# Patient Record
Sex: Male | Born: 1935 | Race: Black or African American | Hispanic: No | Marital: Married | Smoking: Former smoker
Health system: Southern US, Community
[De-identification: ages and names within clinical notes are randomized; demographics above are authoritative.]

## PROBLEM LIST (undated history)

## (undated) DIAGNOSIS — I10 Essential (primary) hypertension: Secondary | ICD-10-CM

## (undated) DIAGNOSIS — M1710 Unilateral primary osteoarthritis, unspecified knee: Secondary | ICD-10-CM

## (undated) DIAGNOSIS — G309 Alzheimer's disease, unspecified: Secondary | ICD-10-CM

## (undated) DIAGNOSIS — F0280 Dementia in other diseases classified elsewhere without behavioral disturbance: Secondary | ICD-10-CM

## (undated) DIAGNOSIS — Z9889 Other specified postprocedural states: Secondary | ICD-10-CM

## (undated) DIAGNOSIS — I2510 Atherosclerotic heart disease of native coronary artery without angina pectoris: Secondary | ICD-10-CM

## (undated) DIAGNOSIS — M109 Gout, unspecified: Secondary | ICD-10-CM

## (undated) DIAGNOSIS — C801 Malignant (primary) neoplasm, unspecified: Secondary | ICD-10-CM

## (undated) HISTORY — PX: KNEE SURGERY: SHX244

## (undated) HISTORY — PX: CORONARY ANGIOPLASTY WITH STENT PLACEMENT: SHX49

---

## 2001-07-02 ENCOUNTER — Ambulatory Visit (HOSPITAL_COMMUNITY): Admission: RE | Admit: 2001-07-02 | Discharge: 2001-07-02 | Payer: Self-pay | Admitting: Cardiology

## 2001-07-02 ENCOUNTER — Encounter: Payer: Self-pay | Admitting: Cardiology

## 2002-03-25 ENCOUNTER — Inpatient Hospital Stay (HOSPITAL_COMMUNITY): Admission: EM | Admit: 2002-03-25 | Discharge: 2002-03-28 | Payer: Self-pay | Admitting: Emergency Medicine

## 2002-03-25 ENCOUNTER — Encounter: Payer: Self-pay | Admitting: Emergency Medicine

## 2002-03-26 ENCOUNTER — Encounter: Payer: Self-pay | Admitting: Cardiology

## 2002-03-27 ENCOUNTER — Encounter: Payer: Self-pay | Admitting: Cardiology

## 2002-12-24 ENCOUNTER — Encounter: Payer: Self-pay | Admitting: Emergency Medicine

## 2002-12-24 ENCOUNTER — Inpatient Hospital Stay (HOSPITAL_COMMUNITY): Admission: EM | Admit: 2002-12-24 | Discharge: 2003-01-01 | Payer: Self-pay | Admitting: Emergency Medicine

## 2002-12-25 ENCOUNTER — Encounter: Payer: Self-pay | Admitting: Neurology

## 2002-12-25 ENCOUNTER — Encounter: Payer: Self-pay | Admitting: Cardiology

## 2002-12-29 ENCOUNTER — Encounter: Payer: Self-pay | Admitting: Neurology

## 2003-11-10 ENCOUNTER — Ambulatory Visit (HOSPITAL_COMMUNITY): Admission: RE | Admit: 2003-11-10 | Discharge: 2003-11-10 | Payer: Self-pay | Admitting: Cardiology

## 2005-10-25 ENCOUNTER — Inpatient Hospital Stay (HOSPITAL_BASED_OUTPATIENT_CLINIC_OR_DEPARTMENT_OTHER): Admission: RE | Admit: 2005-10-25 | Discharge: 2005-10-25 | Payer: Self-pay | Admitting: Cardiology

## 2005-11-06 ENCOUNTER — Inpatient Hospital Stay (HOSPITAL_COMMUNITY): Admission: RE | Admit: 2005-11-06 | Discharge: 2005-11-07 | Payer: Self-pay | Admitting: Cardiology

## 2006-05-11 ENCOUNTER — Emergency Department (HOSPITAL_COMMUNITY): Admission: EM | Admit: 2006-05-11 | Discharge: 2006-05-11 | Payer: Self-pay | Admitting: Emergency Medicine

## 2007-07-08 ENCOUNTER — Inpatient Hospital Stay (HOSPITAL_BASED_OUTPATIENT_CLINIC_OR_DEPARTMENT_OTHER): Admission: RE | Admit: 2007-07-08 | Discharge: 2007-07-08 | Payer: Self-pay | Admitting: Cardiology

## 2007-10-13 ENCOUNTER — Ambulatory Visit (HOSPITAL_COMMUNITY): Admission: RE | Admit: 2007-10-13 | Discharge: 2007-10-13 | Payer: Self-pay | Admitting: Urology

## 2007-11-16 ENCOUNTER — Emergency Department (HOSPITAL_COMMUNITY): Admission: EM | Admit: 2007-11-16 | Discharge: 2007-11-16 | Payer: Self-pay | Admitting: Family Medicine

## 2010-01-04 ENCOUNTER — Inpatient Hospital Stay (HOSPITAL_COMMUNITY): Admission: EM | Admit: 2010-01-04 | Discharge: 2010-01-06 | Payer: Self-pay | Admitting: Emergency Medicine

## 2010-07-27 LAB — BASIC METABOLIC PANEL
BUN: 7 mg/dL (ref 6–23)
Calcium: 7.8 mg/dL — ABNORMAL LOW (ref 8.4–10.5)
Calcium: 8.1 mg/dL — ABNORMAL LOW (ref 8.4–10.5)
Chloride: 101 mEq/L (ref 96–112)
Creatinine, Ser: 1.48 mg/dL (ref 0.4–1.5)
GFR calc Af Amer: 56 mL/min — ABNORMAL LOW (ref >60)
GFR calc non Af Amer: 57 mL/min — ABNORMAL LOW (ref >60)
Glucose, Bld: 111 mg/dL — ABNORMAL HIGH (ref 70–99)
Sodium: 137 mEq/L (ref 135–145)

## 2010-07-27 LAB — DIFFERENTIAL
Eosinophils Absolute: 0.2 10*3/uL (ref 0.0–0.7)
Eosinophils Relative: 2 % (ref 0–5)
Lymphs Abs: 1.4 10*3/uL (ref 0.7–4.0)
Monocytes Absolute: 1.3 10*3/uL — ABNORMAL HIGH (ref 0.1–1.0)
Monocytes Relative: 14 % — ABNORMAL HIGH (ref 3–12)
Neutrophils Relative %: 68 % (ref 43–77)

## 2010-07-27 LAB — POCT CARDIAC MARKERS
CKMB, poc: <1 ng/mL — ABNORMAL LOW (ref 1.0–8.0)
Myoglobin, poc: 145 ng/mL (ref 12–200)
Myoglobin, poc: 166 ng/mL (ref 12–200)

## 2010-07-27 LAB — COMPREHENSIVE METABOLIC PANEL
ALT: 12 U/L (ref 0–53)
AST: 25 U/L (ref 0–37)
Albumin: 3.3 g/dL — ABNORMAL LOW (ref 3.5–5.2)
Alkaline Phosphatase: 77 U/L (ref 39–117)
Calcium: 8.3 mg/dL — ABNORMAL LOW (ref 8.4–10.5)
GFR calc Af Amer: 44 mL/min — ABNORMAL LOW (ref >60)
Potassium: 3.5 mEq/L (ref 3.5–5.1)
Sodium: 137 mEq/L (ref 135–145)
Total Protein: 6.8 g/dL (ref 6.0–8.3)

## 2010-07-27 LAB — CBC
HCT: 34.4 % — ABNORMAL LOW (ref 39.0–52.0)
MCH: 26.7 pg (ref 26.0–34.0)
MCHC: 32 g/dL (ref 30.0–36.0)
MCHC: 33.1 g/dL (ref 30.0–36.0)
MCV: 81.6 fL (ref 78.0–100.0)
Platelets: 153 10*3/uL (ref 150–400)
Platelets: 160 10*3/uL (ref 150–400)
RDW: 13.4 % (ref 11.5–15.5)
RDW: 13.5 % (ref 11.5–15.5)
RDW: 13.5 % (ref 11.5–15.5)
WBC: 9.6 10*3/uL (ref 4.0–10.5)

## 2010-07-27 LAB — URIC ACID: Uric Acid, Serum: 7.9 mg/dL — ABNORMAL HIGH (ref 4.0–7.8)

## 2010-07-27 LAB — LIPID PANEL
HDL: 38 mg/dL — ABNORMAL LOW (ref >39)
Total CHOL/HDL Ratio: 4.1 RATIO
VLDL: 21 mg/dL (ref 0–40)

## 2010-07-27 LAB — APTT: aPTT: 108 seconds — ABNORMAL HIGH (ref 24–37)

## 2010-07-27 LAB — CARDIAC PANEL(CRET KIN+CKTOT+MB+TROPI)
Total CK: 147 U/L (ref 7–232)
Troponin I: 0.02 ng/mL (ref 0.00–0.06)

## 2010-07-27 LAB — PROTIME-INR: INR: 1.13 (ref 0.00–1.49)

## 2010-07-27 LAB — CK TOTAL AND CKMB (NOT AT ARMC)
CK, MB: 2.3 ng/mL (ref 0.3–4.0)
Total CK: 123 U/L (ref 7–232)
Total CK: 155 U/L (ref 7–232)

## 2010-09-26 NOTE — Cardiovascular Report (Signed)
NAME:  Patrick [name], Patrick [name]               ACCOUNT NO.:  [id number]   MEDICAL RECORD NO.:  [id number]          PATIENT TYPE:  OIB   LOCATION:  NA                           FACILITY:  [facility]   PHYSICIAN:  Mohan N. Sharyn Lull, M.D. DATE OF BIRTH:  July 04, 1930   DATE OF PROCEDURE:  07/08/2007  DATE OF DISCHARGE:                            CARDIAC CATHETERIZATION   PROCEDURE:  Left cardiac cath with selective left and right coronary  angiography, left ventriculography via right groin using Judkins  technique.   INDICATION FOR THE PROCEDURE:  Mr. Ozer is a 77 year old black male  with past medical history significant for coronary artery disease,  status post PCI to LAD and RCA in August 2004, hypertension, history of  CVA, hypercholesteremia, hyperhomocysteinemia, gouty arthritis.  Complains of retrosternal chest pressure, localized, associated with  diaphoresis, relieved with rest and sublingual nitro.  Denies any  nausea, vomiting.  Denies shortness of breath.  Denies palpitation,  lightheadedness or syncope.  Denies PND, orthopnea, leg swelling.  EKG  done in my office showed new minor T-wave inversions in the inferior  leads.   PAST MEDICAL HISTORY:  As above, plus history of CA of prostate.   PAST SURGICAL HISTORY:  He has had bilateral knee surgery.  He had PCI  to LAD and RCA as above.   ALLERGIES:  NO KNOWN DRUG ALLERGIES.   MEDICATIONS:  At home, he is on enteric-coated aspirin 81 mg p.o. daily,  Plavix 75 mg p.o. daily, Toprol XL 50 mg p.o. daily, Norvasc 5 mg p.o.  daily, Imdur 30 mg p.o. daily, Niaspan 500 mg p.o. daily, colchicine 0.6  mg p.o. daily, doxazosin 1 tablet daily, I believe it is 2 mg daily at  night, Nitrostat 0.4 mg sublingual, use as directed.   SOCIAL HISTORY:  He is married and has 10 children.  Smoked 1 pack per  week for 24 years, quit 30+ years ago.  No history of alcohol abuse.  He  is retired.  He worked as a Editor, commissioning.   FAMILY HISTORY:  Positive for  coronary artery disease.   PHYSICAL EXAMINATION:  GENERAL:  On examination, he is alert and  oriented x 3, in no acute distress.  VITAL SIGNS:  Blood pressure was 140/86, pulse was 70 and regular.  HEENT:  Conjunctivae was pink.  NECK:  Supple.  No JVD.  No bruit.  LUNGS:  Clear to auscultation without rhonchi or rales.  CARDIOVASCULAR:  S1 and S2 were normal.  There was a soft systolic  murmur.  ABDOMEN:  Abdomen was soft.  Bowel sounds were present.  Obese,  nontender.  EXTREMITIES:  There was no clubbing, cyanosis or edema.   IMPRESSION:  1. New onset angina, rule out progression of coronary artery disease.  2. Coronary artery disease, status post percutaneous coronary      intervention to left anterior descending and right coronary artery      in the past.  3. Hypertension.  4. Hypercholesteremia.  5. Hyperhomocysteinemia.  6. History of cerebrovascular accident, cancer of the prostate.  7. Remote history of tobacco abuse.  Discussed with the patient regarding left cath, its risks and benefits,  i.e. death, stroke, local vascular complications, etc., and he consented  for the procedure.   PROCEDURE:  After obtaining informed consent, the patient was brought to  the cath lab and was placed on fluoroscopy table.  Right groin was  prepped and draped in the usual fashion, and 2% Xylocaine was used for  local anesthesia in the right groin.  With the help of a thin-wall  needle, initially a 4-French sheath was placed, which was replaced with  a 5-French sheath without difficulty.  Next, a 4-French Judkins catheter  was advanced over the wire under fluoroscopic guidance up to the  ascending aorta.  Wire was pulled out.  Catheter was aspirated and  connected to the manifold.  Catheter was further advanced and attempted  to engage into left coronary ostium without success.  This catheter was  switched to a 5-French left Judkins catheter, which was advanced over  the wire under  fluoroscopic guidance up to the ascending aorta.  Wire  was pulled out.  The catheter was aspirated and connected to the  manifold.  The catheter was further advanced and engaged into left  coronary ostium without difficulty.  Multiple views of the left system  were taken.  Next, the catheter was disengaged and was pulled out over  the wire and was replaced with 4-French 3-D right diagnostic catheter,  which was advanced over the wire under fluoroscopic guidance up to the  ascending aorta.  Wire was pulled out.  The catheter was aspirated and  connected to the manifold.  Catheter was further advanced and engaged  into right coronary ostium.  Multiple views of the right system were  taken.  Next, the catheter was disengaged and was pulled out over the  wire and was replaced with a 4-French pigtail catheter, which was  advanced over the wire under fluoroscopic guidance up to the ascending  aorta.  Wire was pulled out,  the catheter was aspirated and connected  to the manifold.  Catheter was further advanced across aortic valve into  the LV.  LV pressures were recorded.  Next, LV gram was done in 30  degree RAO position.  Post angiographic pressures were recorded from LV  and then pullback pressures were recorded from the aorta.  There was no  gradient across the aortic valve.  Next, the pigtail catheter was pulled  out over the wire.  Sheaths were aspirated and flushed.   FINDINGS:  LV showed good LV systolic function, EF of 55% to 60%.  Left  main had 20% distal stenosis.  The LAD has 30% to 40% in-stent proximal  restenosis and 25% to 35% distal stenosis.  Diagonal 1 has complex  proximal bifurcation lesion, which is 70% to 75%, which is not suitable  for PCI.  Diagonal 2 is very small, ramus has 50% to 60% proximal  stenosis.  Left circumflex has 50% to 60% proximal stenosis right at  bifurcation with the ramus and 15% to 20% mid stenosis.  The OM1 and OM2  were very, very small.  OM3  had 20% to 30% proximal and mid stenosis.  OM4 was very, very small.  RCA had 40% to 50% proximal stenosis and 20%  to 30% mid stenosis and 70% to 75% distal stenosis at the bifurcation  with PDA.  Distal RCA is a small vessel.  The PDA has 20% to 30%  multiple sequential stenoses.   The patient  tolerated the procedure well.  There were no complications.  The plan is to maximize antianginal medications and treat medically.      Eduardo Osier. Sharyn Lull, M.D.  Electronically Signed     MNH/MEDQ  D:  07/08/2007  T:  07/08/2007  Job:  [id number]   cc:   Cath Lab

## 2010-09-29 NOTE — Discharge Summary (Signed)
NAME:  Patrick [name], Patrick [name]                         ACCOUNT NO.:  [id number]   MEDICAL RECORD NO.:  [id number]                   PATIENT TYPE:  INP   LOCATION:  5531                                 FACILITY:  [facility]   PHYSICIAN:  Osvaldo Shipper. Spruill, M.D.             DATE OF BIRTH:  Nov 04, 1930   DATE OF ADMISSION:  03/25/2002  DATE OF DISCHARGE:  03/28/2002                                 DISCHARGE SUMMARY   DISCHARGE DIAGNOSES:  1. Coronary artery disease.  2. Hypertension.  3. Obesity.   HISTORY OF PRESENT ILLNESS:  The patient is a 77 year old patient who  presented initially to the emergency department of the [facility] on March 25, 2002, at which time he complained of  substernal area chest pain.  The patient states that he took two of the  sublingual nitroglycerin with relief.  He was brought to the hospital by EMS  and was evaluated in the department.  During the course of his evaluation,  his chemistries were noted to be within normal limits.  His echocardiogram  revealed some nonspecific STT wave changes.  His chest x-ray revealed some  atelectasis at the right base but no other significant problem.  It was the  opinion after his work-up and evaluation that he should be admitted to rule  out myocardial injury.   HOSPITAL COURSE:  The patient was admitted to the medical service, was  placed on telemetry.  He was seen on pharmacy consultation for Lovenox  administration.  The patient underwent a Cardiolite study.  There was noted  a small subtle area of decreased activity in the apex extending into the  anterior wall at the apex.  It was felt to be insignificant by the radiology  staff.  The patient was noted to have an ejection fraction of 58% and as  noted above, no evidence of ischemia.  The patient then had an esophagogram  done which revealed no stricture, mass or filling defect and no diffuse  impairment in the esophageal motility.  Serial enzymes  were negative for  acute injury.  Electrocardiograms showed normal sinus rhythm with T-wave  abnormalities in the inferior and anterolateral area.   On March 28, 2002, it was the opinion that the patient had received  maximum benefit from hospitalization and could be discharged home with  continuation of work-up as an outpatient.   DISCHARGE MEDICATIONS:  1. Toprol XL 50 one daily.  2. Celebrex 200 mg daily.  3. Isosorbide daily.  4. Allopurinol 300 mg daily.  5. Cardura one at bedtime.  6. Protonix 40 mg daily.  7. Colchicine 0.6 mg one three times a day.   DIET:  The patient was placed on a 2 g sodium restricted low cholesterol  diet.    FOLLOW UP:  The patient is to be seen in the office in one week.  The  patient is to  notify physician immediately if there are any changes,  problems, or concerns.     Ivery Quale, P.A.                       Osvaldo Shipper. Spruill, M.D.    HB/MEDQ  D:  05/19/2002  T:  05/19/2002  Job:  [id number]

## 2010-09-29 NOTE — Discharge Summary (Signed)
NAME:  Patrick [name], Patrick [name]               ACCOUNT NO.:  [id number]   MEDICAL RECORD NO.:  [id number]          PATIENT TYPE:  INP   LOCATION:  6533                         FACILITY:  [facility]   PHYSICIAN:  Mohan N. Sharyn Lull, M.D. DATE OF BIRTH:  1931-05-10   DATE OF ADMISSION:  11/06/2005  DATE OF DISCHARGE:  11/07/2005                                 DISCHARGE SUMMARY   ADMITTING DIAGNOSIS:  1.  New-onset angina status post left catheterization.  2.  Coronary artery disease.  3.  Coronary artery disease.  4.  History of percutaneous coronary intervention to LAD and RCA in the      past.  5.  Hypertension.  6.  Hypercholesteremia.  7.  Hyperhomocysteinemia.  8.  History of cerebrovascular accident.  9.  Carcinoma of the prostate.  10. Remote history of tobacco abuse.   FINAL DIAGNOSIS:  1.  New onset angina.  2.  Status post percutaneous transluminal coronary angioplasty stenting to      OM-3.  3.  Coronary artery disease.  4.  History of percutaneous coronary intervention to LAD and RCA in the      past.  5.  Hypertension.  6.  New onset diabetes mellitus controlled by diet.  7.  Hypercholesteremia.  8.  Hyperhomocysteinemia.  9.  History of cerebrovascular accident, anemia secondary to hydration, and      blood loss during the procedure.  10. Carcinoma of the prostate remote history of tobacco abuse.  11. History of gouty arthritis.  12. Mild renal insufficiency which is stable.   DISCHARGE HOME MEDICATIONS:  1.  Enteric-coated aspirin 325 mg 1 tablet daily for 1 month and then 81 mg      2 tablets daily.  2.  Plavix 75 mg 1 tablet daily with food.  3.  Toprol XL 50 mg 1 tablet daily.  4.  Norvasc 5 mg 1 tablet daily.  5.  Doxazosin 4 mg 1 tablet daily, at night.  6.  __________ 5 mg 1 daily.  7.  Vytorin 10/40 one tablet daily.  8.  Elita Quick 1 tablet daily.  9.  Nitrostat 0.4 mg sublingual use as directed.  10. Feosol 325 mg 1 tablet daily.  11. Colchicine 0.6 mg one tablet  daily as before.   FOLLOWUP:  With me in 1 week and Dr. Shana Chute as scheduled.   CONDITION AT DISCHARGE:  Stable.   DIET:  Low salt, low cholesterol.  The patient has been advised to avoid  sweets and reduce weight, post PTCA stent instructions have been given.  Follow-up with me in 1 week and Dr. Shana Chute as scheduled.   CONDITION AT DISCHARGE:  Stable.   BRIEF HISTORY:  Patient is a 77 year old black male with past medical  history significant for coronary artery disease, status post PTCA stenting  to LAD and RCA in August of 2004.  Hypertension, history of CVA,  hypercholesteremia, hyperhomocysteinemia., gouty arthritis.  He complains of  retrosternal chest pressure described as tightness grade 6/10 localized  associated with diaphoresis, relieved with rest.  The patient also gives  history of exertional dyspnea  and feeling weak and tired.  Denies any  nausea, vomiting.  Denies any shortness of breath.  Denies palpitation,  lightheadedness, or syncope.  The patient underwent left cardiac  catheterization on 06/14 which showed critical stenosis in OM-3, RCA, and  LAD stents were patent with mild InStent restenosis.  The patient was  admitted here for PCI to OM-3.   BRIEF HOSPITAL COURSE:  The patient was a.m. admit and underwent PTCA  stenting to OM-3 without any complications.  The patient tolerated procedure  well.  Postprocedure the patient did not have any episodes of anginal chest  pain.  His EKG is stable.  His groin is stable with no evidence of hematoma  or bruit.  The patient has been ambulating in the room without any problems.  The patient did have some left ankle swelling and pain secondary to gout  which responded to p.o. colchicine.  The patient will be discharged home on  above medications; and will be followed up in my office in 1 week and with  Dr. Shana Chute as scheduled.  The patient also advised to get CBC, BMET, and  uric acid level in 1 week as outpatient            ______________________________  Eduardo Osier. Sharyn Lull, M.D.     MNH/MEDQ  D:  11/07/2005  T:  11/07/2005  Job:  [id number]

## 2010-09-29 NOTE — Discharge Summary (Signed)
NAME:  Patrick [name], Patrick [name]                         ACCOUNT NO.:  [id number]   MEDICAL RECORD NO.:  [id number]                   PATIENT TYPE:  INP   LOCATION:  6524                                 FACILITY:  [facility]   PHYSICIAN:  Osvaldo Shipper. Spruill, M.D.             DATE OF BIRTH:  15-Oct-1930   DATE OF ADMISSION:  12/24/2002  DATE OF DISCHARGE:  01/01/2003                                 DISCHARGE SUMMARY   DISCHARGE DIAGNOSES:  1. Cerebral artery occlusion with cerebral infarct.  2. Angina.  3. Coronary atherosclerosis.  4. Percutaneous transluminal coronary angioplasty.  5. Hypertension.  6. Hyperlipidemia.  7. Gout.  8. Morbid obesity.   HISTORY OF PRESENT ILLNESS:  This is a 77 year old male who presented  initially to the emergency department at [facility] with complaint  of right-sided weakness.  He presented with complaint of inability to hold  out his right arm, and his left leg was dragging earlier.  The patient  stated he became dizzy.  The leg weakness improved; however, there continued  to be some upper extremity weakness present.   The patient was seen by Dr. Thad Ranger in the ED.  It was Dr. Thad Ranger'  opinion that the patient had a probable left brain stroke.  The patient was  placed on heparin and plans made for MRI, carotid Dopplers, and  echocardiogram.   The patient was seen by pharmacy for heparin protocol.  The patient also  underwent echocardiogram as noted above which revealed overall left  ventricular systolic function was normal.  The left ventricular ejection  fraction was estimated at 55 to 65%.  There was no left ventricular regional  wall motion abnormality.  The aortic valve was mildly thickened.  There was  mild mitral anular calcification and mild mitral regurgitation.  The atrium  was mildly dilated, and the estimated pulmonary artery systolic pressure was  mildly increased.   Electrocardiogram revealed normal sinus rhythm with nonspecific  T wave  abnormality.  No significant changes were noted.   The patient also underwent carotid artery Doppler studies which revealed no  significant ICA stenosis with vertebral antegrade flow.  The MRI revealed  acute subacute infarction posterior limb of internal capsule on the left and  significant disease in the distal right vertebral artery and proximal basal  artery due to atherosclerotic disease.   The patient was seen by the stroke team for evaluation and beginning  treatment and rehabilitation of any deficits.   On August 17, the patient began to have some problems with chest pain during  his procedure.  Plans were made at this point for cardiac catheterization.  The patient was seen by Dr. Sharyn Lull.  Risks and benefits were discussed with  the patient, and he accepts these risks and benefits.   On August 19, the patient underwent cardiac catheterization.  The  catheterization revealed good left ventricular systolic function with an  ejection fraction of 55 to 60%.  The left main was patent.  The left  anterior descending had an 85 to 90% sequential lesion.  The diagonal #1 had  a 20 to 30% ostial stenosis.  The OM-3 was a medium size vessel with a 30 to  40% mid stenosis, and the right coronary artery had a 70% proximal stenosis.  The patient required a PTCA of the mid portion of the right coronary artery  earlier which had a 30 to 40% sequential stenosis.  The distal right  coronary artery had a 50 to 60% stenosis, and the vessel was noted to be  somewhat small.   The patient underwent successful PTCA of the proximal LAD.  There was also  PTCA and stenting of the right coronary artery.  The patient was given  Integrilin and 300 mg of Plavix.  The patient tolerated the procedure  without major problem.  The patient was then started on cardiac  rehabilitation phase I.   On august 20, the patient showed significant improvement.  There was no  angina.  Plans were made for the  patient to be discharged home.   DISCHARGE MEDICATIONS:  1. Toprol 50 mg daily.  2. Zocor 20 mg daily.  3. Colchicine 0.6 mg 3 times a day.  4. Nitroglycerin patch 0.4 mg per hour.  5. Aspirin daily.  6. Plavix 75 mg daily.  7. K-Dur 20 mEq daily.  8. Sublingual nitroglycerin as needed.  9. Percocet 5 mg 1 every 4 to 6 hours as needed.   DIET:  The patient was placed on a 2 g sodium restricted diet.   FOLLOW UP:  He is to see Dr. Shana Chute in one to two weeks.  He is to see Dr.  Sharyn Lull in one week.  He is to notify physician immediately of any changes,  problems, or concerns.      Ivery Quale, P.A.                       Osvaldo Shipper. Spruill, M.D.    HB/MEDQ  D:  02/05/2003  T:  02/06/2003  Job:  [id number]

## 2010-09-29 NOTE — H&P (Signed)
NAME:  Patrick [name], Patrick [name]                         ACCOUNT NO.:  [id number]   MEDICAL RECORD NO.:  [id number]                   PATIENT TYPE:  EMS   LOCATION:  MAJO                                 FACILITY:  [facility]   PHYSICIAN:  Michael L. Thad Ranger, M.D.           DATE OF BIRTH:  1930-11-01   DATE OF ADMISSION:  12/24/2002  DATE OF DISCHARGE:                                HISTORY & PHYSICAL   CHIEF COMPLAINT:  Right-sided weakness.   HISTORY OF PRESENT ILLNESS:  This is one of several [facility]  admissions for this 77 year old man with a past medical history which  includes hypertension and coronary artery disease.  He was doing well today  and actually played 18 holes of golf this morning.  About 2:40 p.m. this  afternoon he was in [employer] when he noted the sudden onset of right upper  extremity weakness and noted that he was dragging his right leg.  His wife  called their primary physician's office and he was advised to go to the  emergency room.  The symptoms lasted for 30-60 minutes and then cleared  completely, and in the emergency room he was felt to be neurologically  normal.  However, shortly after my arrival he noted recurrence of his right  upper extremity weakness which lasted a minute or two and then subsequently  resolved.  He denies any associated pain with this and has had no chest  pain, palpitations, shortness of breath, or headache along with these  symptoms.  He denies every having any previous similar symptoms.   MEDICAL HISTORY:  Remarkable for history of coronary artery disease with  angioplasty x3, the last in 1998.  He also has history of hypertension.  He  denies diabetes.  He has been seeing Dr. Etta Grandchild for a problem with bleeding  from his penis; he had negative biopsies about a year ago and further  biopsies are planned for later this month.   FAMILY HISTORY:  He has a brother who has had multiple strokes and has many  family members with  diabetes.   SOCIAL HISTORY:  He is independent in his activities of daily living.  He is  a retired [employer] architect.  He has a remote tobacco history.   ALLERGIES:  No known drug allergies.   MEDICATIONS:  1. Cardura 8 mg daily.  2. Toprol-XL 50 mg daily.  3. Imdur uncertain dose - probably 30 mg daily.   He is not on aspirin or any other blood thinners.   REVIEW OF SYSTEMS:  He reports recently noting some blood in his ejaculate  related to the problem mentioned above.  He has some ear wax which has been  interfering with his hearing in his left ear.  A 10-system review of systems  otherwise negative except for HPI and ER records.   PHYSICAL EXAMINATION:  VITAL SIGNS:  Temperature 97.6, blood pressure  165/98, pulse  65, respirations 20.  GENERAL:  He is alert in no evident distress.  HEENT:  Head:  Cranium is normocephalic and atraumatic.  Oropharynx is  benign.  NECK:  Supple without carotid bruits.  HEART:  Regular rate and rhythm without murmurs.  CHEST:  Clear to auscultation.  ABDOMEN:  Somewhat obese but soft and normal bowel sounds.  EXTREMITIES:  Show 1+ edema.  Pedal pulses are present.  NEUROLOGIC:  Mental status:  He is awake and alert.  Speech is fluent and  not dysarthric.  Mood is euthymic and affect appropriate.  He has no  evidence of aphasia.  Cranial nerves:  Funduscopic exam is benign.  Pupils  equal and reactive.  Extraocular movements normal without nystagmus.  Visual  fields full to confrontation.  There is a mild right facial droop but facial  strength is actually pretty good.  Tongue and palate move normally and  symmetrically.  Facial sensation is intact to pinprick.  Motor:  Normal bulk  and tone, normal strength in all tested extremity muscles by the time formal  exam is undertaken, but I did notice that he had right upper extremity  weakness before proceeding with examination.  Sensation:  Intact to light  touch and pinprick in all extremities.   Coordination:  Rapid movements are  performed a little bit slowly on the right.  He has a little bit of ataxia  with finger-to-nose testing but not heel-to-shin testing on the right.  Gait:  He arises from a chair easily.  His stance is fairly normal.  His  gait is a bit tentative but not bad.  Reflexes 2+ and symmetric.  Toes are  downgoing.   LABORATORY REVIEW:  CBC is unremarkable.  CMET is remarkable only for a  minimally low potassium of 3.4.  CT of the head is remarkable for mild small  vessel disease but otherwise no acute abnormality.   IMPRESSION:  Probable left brain stroke.  He seems to be having some  fluctuating symptoms.   PLAN:  Will admit.  Will place on heparin due to fluctuating symptoms.  Will  proceed with routine stroke workup including MRI, MRA, carotid and  transcranial Dopplers, echocardiogram, and stroke labs.  Disposition pending  above.                                                Michael L. Thad Ranger, M.D.    MLR/MEDQ  D:  12/24/2002  T:  12/25/2002  Job:  [id number]

## 2010-09-29 NOTE — Cardiovascular Report (Signed)
NAME:  Patrick [name], Patrick [name]               ACCOUNT NO.:  [id number]   MEDICAL RECORD NO.:  [id number]          PATIENT TYPE:  OIB   LOCATION:  1965                         FACILITY:  [facility]   PHYSICIAN:  Mohan N. Sharyn Lull, M.D. DATE OF BIRTH:  [birth year]   DATE OF PROCEDURE:  10/25/2005  DATE OF DISCHARGE:                              CARDIAC CATHETERIZATION   PROCEDURE:  Left cardiac cath with selective left and right coronary  angiography, LV graft via right groin using Judkins technique.   INDICATIONS FOR PROCEDURE:  Mr. Towery is a 77 year old, black male, with  past medical history significant for coronary artery disease, status post  PCI to LAD and RCA in August 2004, hypertension, history of CVA,  hypercholesteremia, hyperhomocysteinemia, gouty arthritis.  Complains of  retrosternal chest pressure described as tightness grade 6/10, localized,  associated with diaphoresis, relieved with rest.  The patient also gives  history of exertional dyspnea and feeling weak and tired.  Denies any nausea  or vomiting.  Denies shortness of breath.  Denies palpitation,  lightheadedness or syncope.   PAST MEDICAL HISTORY:  As above plus history of CA of prostate.   PAST SURGICAL HISTORY:  He had bilateral knee surgery in the past, had PCI  to LAD and RCA in August 2004 as above.   ALLERGIES:  NO KNOWN DRUG ALLERGIES.   MEDICATIONS AT HOME:  1.  Toprol-XL 50 mg p.o. daily.  2.  Doxazosin 8 mg p.o. h.s.  3.  Lasix 40 mg p.o. daily.  4.  K-Dur 20 mEq p.o. daily.  5.  Aspirin 81 mg p.o. daily.  6.  Proscar 5 mg p.o. daily.   SOCIAL HISTORY:  He is married, has 10 children.  Smoked 1 pack per week for  24 years, quit 30+ years ago.  No history of alcohol abuse.  He is retired,  worked as Editor, commissioning.   FAMILY HISTORY:  Positive for coronary artery disease.   EXAMINATION:  GENERAL:  He was alert, awake, oriented x3 in no acute  distress.  VITAL SIGNS:  Blood pressure was 130/84, pulse was 65  regular.  EYES:  Conjunctiva was pink.  NECK:  Supple.  No JVD, no bruit.  LUNGS:  Clear to auscultation without rhonchi or rales.  CARDIOVASCULAR EXAM:  S1 and S2 normal.  There was soft systolic murmur.  ABDOMEN:  Soft.  Bowel sounds were present, obese, nontender.  EXTREMITIES:  There is no clubbing, cyanosis or edema.   IMPRESSION:  1.  New-onset angina.  2.  Rule out progression of coronary artery disease, CAD.  3.  History of status post percutaneous coronary intervention to left      anterior descending and right coronary artery.  4.  Hypertension.  5.  Hypercholesteremia.  6.  Hyperhomocysteinemia.  7.  History of cerebrovascular accident.  8.  Cancer of prostate.  9.  Remote history of tobacco abuse.   Discussed with the patient regarding left cath, possible PTCA stent, left  cath, its risks and benefits, i.e., death, MI, stroke, need for emergency  CABG, local vascular complications, etc., and consented for  the procedure.   PROCEDURE:  After obtaining the informed consent, the patient was brought to  the cath lab and was placed on fluoroscopy table.  Right groin was prepped  and draped in usual fashion.  Two percent Xylocaine was used for local  anesthesia in the right groin. With the help of thin-wall needle, 4-French  arterial sheath was placed.  Sheath was aspirated and flushed.  Next, a 4-  French left Judkins catheter was advanced over the wire under fluoroscopic  guidance up to the ascending aorta.  Wire was pulled out.  The catheter was  aspirated and connected to the manifold.  Catheter was further advanced and  engaged into the left coronary ostium.  Multiple views of the left system  were taken.  Next, the catheter was disengaged and was pulled out over the  wire and was replaced with 4-French right 3-D catheter, which was advanced  over the wire under fluoroscopic guidance into the ascending aorta.  Catheter was further advanced and engaged into right coronary  ostium.  Multiple views of the right system were obtained.  Next, the catheter was  disengaged and was pulled out over the wire and was replaced with 4-French  pigtail catheter, which was advanced over the wire under fluoroscopic  guidance up to the ascending aorta.  The catheter was further advanced  across the aortic valve into LV.  LV pressures were recorded.  Next, LV  graft was done in 30 degree RAO position.  Post angiographic pressures were  recorded from LV and then pullback pressures were recorded from the aorta.  There was no significant gradient across the aortic valve.  Next, the  pigtail catheter was pulled out.  Sheaths aspirated and flushed.   FINDINGS:  LV showed good LV systolic function.  LVH, EF of 60-65%.  Left  main has 10-15% distal stenosis.  LAD has 20-30% proximal stenosis and also  20-30% proximal end of the stent stenosis with TIMI grade III distal flow  and 20-30% distal stenosis.  Diagonal 1 has 60-70% ostial stenosis, which is  a complex lesion.  This diagonal bifurcates into superior in two branches,  just beyond the ostium.  Diagonal 2 and 3 were very, very small.  Left  circumflex has 40-50% ostial and mid stenosis.  OM-1 has 50-60% proximal  stenosis.  OM-2 is very small, which is patent.  OM-3 has 20-30% proximal  stenosis and 70 and 85% sequential mid stenosis.  RCA has 30-40% proximal  stenosis proximal to the stent.  Stented segment is patent and also has 20-  30% mid and distal stenosis and 70% stenosis at the bifurcation of the PDA  distally.  The vessel is small.  PDA has 20-30% multiple sequential  stenosis.  The patient tolerated procedure well.  There were no  complications.  The patient was transferred to recovery room in stable  condition.  Will review the films further and possibly schedule for PCI to  OM-3 if patient and family agrees.           ______________________________  Eduardo Osier Sharyn Lull, M.D.    MNH/MEDQ  D:  10/25/2005  T:   10/25/2005  Job:  [id number]   cc:   Osvaldo Shipper. Spruill, M.D.  Fax: [phone]

## 2010-09-29 NOTE — Cardiovascular Report (Signed)
NAME:  RODRIGO, MCGRANAHAN NO.:  [id number]   MEDICAL RECORD NO.:  [id number]          PATIENT TYPE:  INP   LOCATION:  2807                         FACILITY:  [facility]   PHYSICIAN:  Eduardo Osier. Sharyn Lull, M.D. DATE OF BIRTH:  [birth year]   DATE OF PROCEDURE:  11/06/2005  DATE OF DISCHARGE:                              CARDIAC CATHETERIZATION   PROCEDURE:  1.  Successful PTCA to mid obtuse marginal 3 using 2.5 x 12 mm long Voyager      balloon.  2.  Successful deployment of 3 x 23 mm long Cypher drug-eluting stent in mid      obtuse marginal 3.  3.  Successful post dilatation of this stent using 3 x 8 mm long PowerSail      balloon.   INDICATIONS FOR PROCEDURE:  Mr. Hernan is 77 year old black male with past  medical history significant for coronary artery disease status post PTCA and  stenting to LAD and RCA in August of 2004, hypertension, history of CVA,  hypercholesteremia, hyperhomocysteinemia, gouty arthritis.  Complains of  retrosternal chest pain described as pressure and tightness, grade 6/10,  localized, associated with diaphoresis, relieved with rest.  The patient  also gives history of exertional dyspnea and feeling weak and tired.  Denies  any nausea, vomiting.  Denies shortness of breath.  Denies palpitation,  lightheadedness, or syncope.  Patient subsequently underwent left cardiac  catheterization on October 25, 2005 and was noted to have sequential 70 and 85%  mid OM3 stenosis.  LAD stented and RCA stented portions had mild disease.  Diagonal 1 had 60-70% ostial stenosis.  Left circumflex has 40-50% ostial  and mid stenosis.  OM1 has 50-60% proximal stenosis.  Patient is admitted  here for PCI to [facility].  After obtaining the informed consent patient was  brought to the catheterization laboratory and was placed on fluoroscopy  table.  Right groin was prepped and draped in usual fashion.  2% Xylocaine  was used for local anesthesia in the right groin.  With the help  of thin  wall needle 7-French arterial sheath was placed.  The sheath was aspirated  and flushed.  Next, 7-French left Judkins catheter was advanced over the  wire under fluoroscopic guidance up to the ascending aorta.  Wire was pulled  out.  The catheter was aspirated and connected to the manifold.  Catheter  could not be engaged into the left coronary ostium.  Multiple 7-French  guiding catheters were used 3.5, 4, and 4.5 Judkins and 3 XB 7-French guide  which could not be engaged and then 6-French JL4 guiding was taken which was  advanced over the wire under fluoroscopic guidance up to the ascending  aorta.  Wire was pulled out.  The catheter was aspirated and connected to  the manifold.  Catheter was further advanced and engaged into left coronary  ostium without difficulty.   INTERVENTIONAL PROCEDURE:  Successful PTCA to mid OM3 was done using 2.5 x  12 mm long Voyager balloon for pre dilatation and then 3 x 23 mm long Cypher  drug-eluting stent was deployed at [employer]  atmospheres pressure in mid OM3 using  double wire technique for support.  Stent was post dilated using 3 x 8 mm  long PowerSail balloon going up to 18 atmospheric pressure.  Lesion was  dilated from 70 and 85% to 0% residual with excellent TIMI grade 3 distal  flow without evidence of dissection or distal embolization.  Patient  received weight-  based Angiomax and 300 mg of Plavix during the procedure.  Patient was  started pre procedure Plavix approximately one week ago.  Patient tolerated  procedure well.  There are no complications.  Patient was transferred to  recovery room in stable condition.           ______________________________  Eduardo Osier Sharyn Lull, M.D.     MNH/MEDQ  D:  11/06/2005  T:  11/06/2005  Job:  [id number]   cc:   Cath Lab   Osvaldo Shipper. Spruill, M.D.  Fax: [phone]

## 2010-09-29 NOTE — Cardiovascular Report (Signed)
NAME:  Patrick [name], Patrick [name]                         ACCOUNT NO.:  [id number]   MEDICAL RECORD NO.:  [id number]                   PATIENT TYPE:  INP   LOCATION:  6524                                 FACILITY:  [facility]   PHYSICIAN:  Mohan N. Sharyn Lull, M.D.              DATE OF BIRTH:  08/10/1930   DATE OF PROCEDURE:  12/31/2002  DATE OF DISCHARGE:                              CARDIAC CATHETERIZATION   PROCEDURES:  1. Left cardiac catheterization and selective left and right coronary     angiography, left ventriculography via right groin using Judkins     technique.  2. Successful percutaneous transluminal coronary angioplasty to proximal     left anterior descending coronary artery using 2.5 x 20 mm long Maverick     balloon and then 3.0 x 20 mm long Maverick balloon.  3. Successful deployment of 3.0 x 33 mm long Cypher drug-eluting stent in     proximal left anterior descending artery.  4. Direct stenting of proximal right coronary artery using 4.0 x 18 mm long     Zeta Multi-Link stent in proximal right coronary artery.   INDICATION FOR PROCEDURE:  Patrick [name] is a 77 year old black male with past  medical history significant for coronary artery disease, status post PTCA to  mid-RCA in 1998, hypertension, history of gouty arthritis, who was admitted  on August 12 because of weakness in right upper and lower extremity which  lasted for 20-30 minutes and subsequently had weakness for a few minutes in  ER, which resolved after starting IV heparin and aspirin.  MRI showed  infarct in the posterior limb of the internal capsule on the left side.  The  patient had not had any residual obvious neurologic deficit.  The patient  subsequently during the hospital stay developed chest pain on August 11  described as, someone is standing on the chest.  Relieved two sublingual  nitroglycerin with relief of chest pain.  EKG showed normal sinus rhythm  with T-wave inversion in anterolateral leads.  The  patient subsequently had  Persantine Cardiolite, which was negative for ischemia with EF of 63%.  The  patient does give history of exertional dyspnea, feeling weak, tired with  minimal exertion.  Due to multiple risk factors and typical angina, chest  pain, and prior and PTCA and stenting in the past, the patient was advised  for catheterization and possible angioplasty.   PAST MEDICAL HISTORY:  As above, plus also a history of gouty arthritis.   PAST SURGICAL HISTORY:  None.   ALLERGIES:  None.   MEDICATIONS:  At home he is on Toprol, Cardura, Imdur, and aspirin.   SOCIAL HISTORY:  He is married, has 10 children.  Smoked one pack per week  for 24 years, quite 30+ years ago.  No history of alcohol abuse.  Retired.  He worked as a Editor, commissioning.   FAMILY HISTORY:  Father died of  complications of pneumonia at the age of 70.  Mother died of Alzheimer's disease.  One sister had coronary artery disease.  One brother is hypertensive.   PHYSICAL EXAMINATION:  GENERAL:  He was alert, awake, oriented x3, in no  acute distress, hemodynamically stable, sinus rhythm on the monitor.  HEENT:  Conjunctivae were pink.  NECK:  Supple, no JVD, no bruits.  CHEST:  Lungs were clear to auscultation without rhonchi or rales.  CARDIOVASCULAR:  S1, S2 was normal.  There was a soft systolic murmur.  There was no S3 gallop.  ABDOMEN:  Soft, bowel sounds were present, nontender.  EXTREMITIES:  There was no clubbing, cyanosis, or edema.   Due to multiple risk factors and typical anginal chest pain and significant  prior history of coronary artery disease, the patient was advised for left  catheterization, possible PTCA and stenting, to which he consented.   After obtaining the informed consent, the patient was brought to the  catheterization lab and was placed on the fluoroscopy table.  The right  groin was prepped and draped in the usual fashion.  Xylocaine 2% was used  for local anesthesia in the  right groin.  With the help of thin-walled  needle, a 6 French arterial sheath was placed.  The sheath was aspirated and  flushed.  Next a 6 French left Judkins catheter was advanced over the wire  under fluoroscopic guidance to the ascending aorta.  The wire was pulled  out, the catheter was aspirated and connected to the manifold.  The catheter  was further advanced and engaged into the left coronary ostium.  Multiple  views of the left system were taken.  Next the catheter was disengaged and  it was pulled out over the wire and was replaced with a 6 French right  Judkins catheter, which was advanced over the wire under fluoroscopic  guidance up to the ascending aorta.  The wire was pulled out.  The catheter  was aspirated and connected to the manifold.  The catheter was further  advanced and engaged into right coronary ostium.  Multiple views of the  right system were taken.  Next the catheter was disengaged and was pulled  out over the wire and was replaced with a 6 French pigtail catheter, which  was advanced over the wire under fluoroscopic guidance up to the ascending  aorta.  The wire was pulled out.  The catheter was aspirated and connected  to the manifold.  The catheter was further advanced across the aortic valve  into the LV.  LV pressures were recorded.  Next LV-graphy was done in 30  degree RAO position.  Post-angiographic pressures were recorded from LV and  then pullback pressures were recorded from the aorta.  There was no gradient  across the aortic valve.  Next the pigtail catheter was pulled out over the  wire, sheaths were aspirated and flushed.   FINDINGS:  1. The LV showed good LV systolic function, EF of 55-60%.  2. Left main was patent.  3. The LAD has 85-95% sequential proximal stenosis.  Diagonal 1 has 20-30%     ostial stenosis.  Diagonal 2 is very, very small, which is patent. 4. Left circumflex is patent proximally and then diffusely tapers down in     the  AV groove.  OM-1 has mild disease.  OM-2 is very, very small.  OM-3     is medium size and has 30-40% mid-sequential stenosis.  5. RCA has 70% proximal  focal stenosis, which was more prominent in RAO     view.  Prior PTCA site at the midportion in RCA has 30-40% sequential     stenosis.  Distal RCA has 50-60% stenosis.  The vessel is very small     distally.   INTERVENTIONAL PROCEDURES:  1. Successful PTCA to proximal LAD was done using 2.5 x 20 mm long Maverick     balloon for pre-dilatation and then 3.0 x 20n mm long balloon.  Multiple     inflations were done going up to 12 atmospheres pressure, and then 3.0 x     33 mm long Cypher drug-eluting stent was applied at 11 atmospheres     pressure, which was fully expanded going up to 15 atmospheres pressure.     The lesion was dilated from 85-95% to 0% residual with excellent TIMI     grade 3 distal flow without evidence of dissection or distal     embolization.  2. Successful direct stenting to proximal RCA was done using 4.0 x 18 mm     long Zeta Multi-Link stent going up to 15 atmospheres pressure.  The     lesion was dilated from 70% to 0% residual with excellent TIMI grade 3     distal flow without evidence of dissection or distal embolization.   The patient received weight-based heparin, Integrilin, and 300 mg of Plavix  during the procedure.  The patient tolerated the procedure well.  There were  no complications.  The patient was transferred to recovery room in stable  condition.                                                   Eduardo Osier. Sharyn Lull, M.D.    MNH/MEDQ  D:  12/31/2002  T:  01/01/2003  Job:  [id number]   cc:   Osvaldo Shipper. Spruill, M.D.  [address] [id number]  [place]  [place] [address]  Fax: [phone]   [facility]

## 2010-11-17 ENCOUNTER — Other Ambulatory Visit (HOSPITAL_COMMUNITY): Payer: Self-pay | Admitting: Cardiology

## 2010-12-01 ENCOUNTER — Other Ambulatory Visit (HOSPITAL_COMMUNITY): Payer: Self-pay

## 2011-02-02 LAB — PROTIME-INR
INR: 0.9
Prothrombin Time: 12.4

## 2011-04-10 ENCOUNTER — Other Ambulatory Visit: Payer: Self-pay | Admitting: Cardiology

## 2011-05-13 ENCOUNTER — Other Ambulatory Visit: Payer: Self-pay | Admitting: Cardiology

## 2011-06-14 ENCOUNTER — Encounter (HOSPITAL_BASED_OUTPATIENT_CLINIC_OR_DEPARTMENT_OTHER)
Admission: RE | Disposition: A | Discharge status: Home / Self Care | Payer: Self-pay | Source: Ambulatory Visit | Attending: Cardiology

## 2011-06-14 ENCOUNTER — Inpatient Hospital Stay (HOSPITAL_BASED_OUTPATIENT_CLINIC_OR_DEPARTMENT_OTHER)
Admission: RE | Admit: 2011-06-14 | Discharge: 2011-06-14 | Disposition: A | Discharge status: Home / Self Care | Payer: Medicare Other | Source: Ambulatory Visit | Attending: Cardiology | Admitting: Cardiology

## 2011-06-14 DIAGNOSIS — I2510 Atherosclerotic heart disease of native coronary artery without angina pectoris: Secondary | ICD-10-CM | POA: Insufficient documentation

## 2011-06-14 DIAGNOSIS — Z8673 Personal history of transient ischemic attack (TIA), and cerebral infarction without residual deficits: Secondary | ICD-10-CM | POA: Insufficient documentation

## 2011-06-14 DIAGNOSIS — E7800 Pure hypercholesterolemia, unspecified: Secondary | ICD-10-CM | POA: Insufficient documentation

## 2011-06-14 DIAGNOSIS — I10 Essential (primary) hypertension: Secondary | ICD-10-CM | POA: Insufficient documentation

## 2011-06-14 SURGERY — JV LEFT HEART CATHETERIZATION WITH CORONARY ANGIOGRAM
Anesthesia: Moderate Sedation

## 2011-06-14 MED ORDER — SODIUM BICARBONATE 8.4 % IV SOLN: INTRAVENOUS | Status: DC

## 2011-06-14 MED ORDER — ACETAMINOPHEN 325 MG PO TABS: 650.0000 mg | ORAL_TABLET | ORAL | Status: DC | PRN

## 2011-06-14 MED ORDER — SODIUM CHLORIDE 0.9 % IV SOLN: INTRAVENOUS | Status: DC

## 2011-06-14 MED ORDER — DIAZEPAM 5 MG PO TABS
5.0000 mg | ORAL_TABLET | Freq: Once | ORAL | Status: AC
  Administered 2011-06-14: 5 mg via ORAL

## 2011-06-14 MED ORDER — SODIUM BICARBONATE 8.4 % IV SOLN
INTRAVENOUS | Status: AC
  Administered 2011-06-14 (×2): via INTRAVENOUS
  Filled 2011-06-14: qty 1000

## 2011-06-14 MED ORDER — OXYCODONE-ACETAMINOPHEN 5-325 MG PO TABS: 1.0000 | ORAL_TABLET | ORAL | Status: DC | PRN

## 2011-06-14 MED ORDER — ONDANSETRON HCL 4 MG/2ML IJ SOLN: 4.0000 mg | Freq: Four times a day (QID) | INTRAMUSCULAR | Status: DC | PRN

## 2011-06-14 NOTE — H&P (Signed)
  H&P in the chart no changes 

## 2011-06-14 NOTE — OR Nursing (Signed)
Tegaderm dressing applied, site level 0, bedrest begins at 1350 

## 2011-06-14 NOTE — Progress Notes (Signed)
Discharge instructions completed, ambulated to bathroom without bleeding, discharged to home via wheelchair and with wife.

## 2011-06-14 NOTE — Procedures (Signed)
Voided 150cc of clear yellow urine

## 2011-06-14 NOTE — OR Nursing (Signed)
Meal served 

## 2011-06-14 NOTE — Op Note (Signed)
Cardiac cath report dictated on 06/14/2011 dictation number is [phone]

## 2011-06-15 NOTE — Cardiovascular Report (Signed)
NAMEGRANVEL, [name]               ACCOUNT NO.:  [id number]  MEDICAL RECORD NO.:  [id number]  LOCATION:                                 FACILITY:  PHYSICIAN:  Elisse Pennick N. Sharyn Lull, M.D.      DATE OF BIRTH:  DATE OF PROCEDURE:  06/14/2011 DATE OF DISCHARGE:                           CARDIAC CATHETERIZATION   PROCEDURE:  Left cardiac cath with selective left and right coronary angiography, measurement of LVEDP via right groin using Judkins technique.  INDICATION FOR THE PROCEDURE:  Patrick [name] is a 78 year old black male with past medical history significant for multivessel coronary artery disease, status post PTCA stenting to LAD, left circumflex, and RCA in the past; hypertension; history of CVA; hypercholesteremia; hyperhomocystinemia; history of gouty arthritis; mild renal insufficiency; history of CA of prostate.  He came to our office complaining of retrosternal chest pain described as heaviness, off and on with minimal exertion, relieves with rest and sublingual nitro. States while vacuuming in the house developed chest pain requiring sublingual nitro to relieve the pain.  Denies any nausea, vomiting, or diaphoresis.  Denies PND, orthopnea, or leg swelling.  Denies palpitation, lightheadedness, or syncope.  Denies relation of chest pain to food, breathing, or movement.  Due to multiple risk factors and typical anginal chest pain, discussed with the patient and his wife regarding left cath, its risks and benefits, i.e., death, MI, stroke, need for emergency CABG, local vascular complications, etc., and consented for the procedure.  PROCEDURE:  After obtaining the informed consent, the patient was brought to the cath lab and was placed on fluoroscopy table.  Right groin was prepped and draped in usual fashion.  Xylocaine 1% was used for local anesthesia in the right groin.  With the help of a thin wall needle, 4-French arterial sheath was placed.  The sheath was aspirated and  flushed.  Next, 4-French left Judkins catheter was advanced over the wire under fluoroscopic guidance up to the ascending aorta.  Wire was pulled out.  The catheter was aspirated and connected to the Manifold. Catheter was further advanced and engaged into left coronary ostium. Multiple views of the left system were taken.  Next, the catheter was disengaged and was pulled out over the wire and was replaced with 4- [place] 3D right diagnostic catheter, which was advanced over the wire under fluoroscopic guidance up to the ascending aorta.  Wire was pulled out.  The catheter was aspirated and connected to the Manifold. Catheter was further advanced and engaged into right coronary ostium. Multiple views of the right system were taken.  Next, catheter was disengaged and was pulled out over the wire and was replaced with 4- French pigtail catheter, which was advanced over the wire under fluoroscopic guidance up to the ascending aorta.  Wire was pulled out. The catheter was aspirated and connected to the Manifold.  Catheter was further advanced across the aortic valve into the LV.  LV pressures were recorded.  Next, pullback pressures were recorded from the aorta.  There was no significant gradient across the aortic valve.  Next, pigtail catheter was pulled out over the wire.  Sheaths were aspirated and flushed.  FINDINGS:  Left main  showed 15%-20% proximal and 5%-10% distal stenosis. LAD has 10%-15% ostial stenosis and 25%-30% mid stenosis and stented portion in the portion is patent except the proximal portion of the stent, which has 25%-30% focal stenosis with TIMI grade 3 distal flow. Diagonal 1 is very small, which bifurcates into superior and inferior branches, which is diffusely narrow.  The diagonal 2 is also very, very small.  [place]amus has 40%-50% ostial stenosis.  Left circumflex has 15%-20% ostial stenosis and then in the midportion that is diffusely diseased in AV groove.  OM1 is very,  very small.  OM2 has 50%-60% ostial and proximal stenosis, which is a moderate size vessel.  RCA has 15%-20% proximal sequential stenosis and 20%-30% distal stenosis.  PDA and PLV branches were very small.  The patient tolerated the procedure well.  There were no complications.  The patient was transferred to recovery room in stable condition.  Plan is to maximize the antianginal medications.     Patrick [name]. Sharyn Lull, M.D.     MNH/MEDQ  D:  06/14/2011  T:  06/15/2011  Job:  [id number]

## 2013-12-10 ENCOUNTER — Emergency Department (HOSPITAL_COMMUNITY)
Admission: EM | Admit: 2013-12-10 | Discharge: 2013-12-10 | Disposition: A | Discharge status: Home / Self Care | Payer: Medicare Other | Attending: Emergency Medicine | Admitting: Emergency Medicine

## 2013-12-10 ENCOUNTER — Encounter (HOSPITAL_COMMUNITY): Payer: Self-pay | Admitting: Emergency Medicine

## 2013-12-10 DIAGNOSIS — R34 Anuria and oliguria: Secondary | ICD-10-CM | POA: Insufficient documentation

## 2013-12-10 DIAGNOSIS — I10 Essential (primary) hypertension: Secondary | ICD-10-CM | POA: Diagnosis not present

## 2013-12-10 DIAGNOSIS — R339 Retention of urine, unspecified: Secondary | ICD-10-CM | POA: Diagnosis not present

## 2013-12-10 DIAGNOSIS — R300 Dysuria: Secondary | ICD-10-CM | POA: Diagnosis present

## 2013-12-10 DIAGNOSIS — R109 Unspecified abdominal pain: Secondary | ICD-10-CM | POA: Diagnosis not present

## 2013-12-10 DIAGNOSIS — Z8546 Personal history of malignant neoplasm of prostate: Secondary | ICD-10-CM | POA: Insufficient documentation

## 2013-12-10 DIAGNOSIS — Z79899 Other long term (current) drug therapy: Secondary | ICD-10-CM | POA: Diagnosis not present

## 2013-12-10 DIAGNOSIS — Z7902 Long term (current) use of antithrombotics/antiplatelets: Secondary | ICD-10-CM | POA: Diagnosis not present

## 2013-12-10 HISTORY — DX: Malignant (primary) neoplasm, unspecified: C80.1

## 2013-12-10 HISTORY — DX: Essential (primary) hypertension: I10

## 2013-12-10 HISTORY — DX: Other specified postprocedural states: Z98.890

## 2013-12-10 LAB — URINALYSIS, ROUTINE W REFLEX MICROSCOPIC
BILIRUBIN URINE: NEGATIVE
Glucose, UA: NEGATIVE mg/dL
HGB URINE DIPSTICK: NEGATIVE
Ketones, ur: NEGATIVE mg/dL
Leukocytes, UA: NEGATIVE
NITRITE: NEGATIVE
PH: 5.5 (ref 5.0–8.0)
Protein, ur: NEGATIVE mg/dL
SPECIFIC GRAVITY, URINE: 1.008 (ref 1.005–1.030)
Urobilinogen, UA: 0.2 mg/dL (ref 0.0–1.0)

## 2013-12-10 NOTE — ED Notes (Signed)
Per pt, states unable to empty bladder-hasnt urinated since last night-saw Urologist yesterday and was told to come here if things got worse

## 2013-12-10 NOTE — ED Provider Notes (Signed)
CSN: [id number]     Arrival date & time 12/10/13  0711 History   First MD Initiated Contact with Patient 12/10/13 0725     Chief Complaint  Patient presents with  . Dysuria     (Consider location/radiation/quality/duration/timing/severity/associated sxs/prior Treatment) HPI Comments: PT presents with constant, worsening lower abd pain.  He has had increased trouble urinating and diminished urination over last week.  States that he went to see a provider yesterday at [facility].  He was able to urinate a small amount in the office and a small amount yesterday evening.  No urination since that time.  He was started on an antibiotic which his wife thinks is Bactim and a bladder pain type medicine.  Over the night, started having increased lower abd pain/pressure.  No n/v/d/fevers.  Does have a hx of prostate cancer.  Patient is a 79 y.o. male presenting with dysuria.  Dysuria Associated symptoms include abdominal pain. Pertinent negatives include no chest pain, no headaches and no shortness of breath.    Past Medical History  Diagnosis Date  . Cancer   . Hypertension   . Hx of cardiac cath    Past Surgical History  Procedure Laterality Date  . Knee surgery     No family history on file. History  Substance Use Topics  . Smoking status: Never Smoker   . Smokeless tobacco: Not on file  . Alcohol Use: No    Review of Systems  Constitutional: Negative for fever, chills, diaphoresis and fatigue.  HENT: Negative for congestion, rhinorrhea and sneezing.   Eyes: Negative.   Respiratory: Negative for cough, chest tightness and shortness of breath.   Cardiovascular: Negative for chest pain and leg swelling.  Gastrointestinal: Positive for abdominal pain. Negative for nausea, vomiting, diarrhea and blood in stool.  Genitourinary: Positive for dysuria and decreased urine volume. Negative for frequency, hematuria, flank pain and difficulty urinating.  Musculoskeletal: Negative for  arthralgias and back pain.  Skin: Negative for rash.  Neurological: Negative for dizziness, speech difficulty, weakness, numbness and headaches.      Allergies  Review of patient's allergies indicates no known allergies.  Home Medications   Prior to Admission medications   Medication Sig Start Date End Date Taking? Authorizing Provider  amLODipine (NORVASC) 5 MG tablet Take 5 mg by mouth daily.   Yes Historical Provider, MD  clopidogrel (PLAVIX) 75 MG tablet Take 75 mg by mouth daily.   Yes Historical Provider, MD  donepezil (ARICEPT) 10 MG tablet Take 10 mg by mouth at bedtime.   Yes Historical Provider, MD  doxazosin (CARDURA) 8 MG tablet Take 4 mg by mouth daily.   Yes Historical Provider, MD  finasteride (PROSCAR) 5 MG tablet Take 5 mg by mouth daily.   Yes Historical Provider, MD  isosorbide mononitrate (IMDUR) 60 MG 24 hr tablet Take 60 mg by mouth daily.   Yes Historical Provider, MD  metoprolol tartrate (LOPRESSOR) 25 MG tablet Take 25 mg by mouth 2 (two) times daily.   Yes Historical Provider, MD  sulfamethoxazole-trimethoprim (BACTRIM DS) 800-160 MG per tablet Take 1 tablet by mouth 2 (two) times daily. 12/04/13 12/18/13 Yes Historical Provider, MD   BP 130/97  Pulse 110  Resp 18  SpO2 100% Physical Exam  Constitutional: He is oriented to person, place, and time. He appears well-developed and well-nourished.  HENT:  Head: Normocephalic and atraumatic.  Eyes: Pupils are equal, round, and reactive to light.  Neck: Normal range of motion. Neck supple.  Cardiovascular: Normal rate, regular rhythm and normal heart sounds.   Pulmonary/Chest: Effort normal and breath sounds normal. No respiratory distress. He has no wheezes. He has no rales. He exhibits no tenderness.  Abdominal: Soft. Bowel sounds are normal. There is tenderness (moderate TTP suprapubic area and across lower abdomen). There is no rebound and no guarding.  Musculoskeletal: Normal range of motion. He exhibits no  edema.  Lymphadenopathy:    He has no cervical adenopathy.  Neurological: He is alert and oriented to person, place, and time.  Skin: Skin is warm and dry. No rash noted.  Psychiatric: He has a normal mood and affect.    ED Course  Procedures (including critical care time) Labs Review Labs Reviewed  URINALYSIS, ROUTINE W REFLEX MICROSCOPIC - Abnormal; Notable for the following:    Color, Urine GREEN (*)    All other components within normal limits  URINE CULTURE    Imaging Review No results found.   EKG Interpretation None      MDM   Final diagnoses:  Urinary retention    Pt had a Foley catheter place, returning about 500cc of clear, green urine.  Feels much better after catheter placement.  No ongoing abd pain.  Will d/c with catheter, leg bag in place.  Advised pt and his wife to make appt to f/u with his urologist in one week for possible catheter removal.  Will continue abx that were prescribed yesterday.  Pt is already taking doxazosin.    Malvin Johns, MD 12/10/13 [phone]

## 2013-12-10 NOTE — Discharge Instructions (Signed)
Acute Urinary Retention °Acute urinary retention is the temporary inability to urinate. °This is a common problem in older men. As men age their prostates become larger and block the flow of urine from the bladder. This is usually a problem that has come on gradually.  °HOME CARE INSTRUCTIONS °If you are sent home with a Foley catheter and a drainage system, you will need to discuss the best course of action with your health care provider. While the catheter is in, maintain a good intake of fluids. Keep the drainage bag emptied and lower than your catheter. This is so that contaminated urine will not flow back into your bladder, which could lead to a urinary tract infection. °There are two main types of drainage bags. One is a large bag that usually is used at night. It has a good capacity that will allow you to sleep through the night without having to empty it. The second type is called a leg bag. It has a smaller capacity, so it needs to be emptied more frequently. However, the main advantage is that it can be attached by a leg strap and can go underneath your clothing, allowing you the freedom to move about or leave your home. °Only take over-the-counter or prescription medicines for pain, discomfort, or fever as directed by your health care provider.  °SEEK MEDICAL CARE IF: °· You develop a low-grade fever. °· You experience spasms or leakage of urine with the spasms. °SEEK IMMEDIATE MEDICAL CARE IF:  °· You develop chills or fever. °· Your catheter stops draining urine. °· Your catheter falls out. °· You start to develop increased bleeding that does not respond to rest and increased fluid intake. °MAKE SURE YOU: °· Understand these instructions. °· Will watch your condition. °· Will get help right away if you are not doing well or get worse. °Document Released: 08/06/2000 Document Revised: 05/05/2013 Document Reviewed: 10/09/2012 °[employer]® Patient Information ©2015 [employer]. This information is not  intended to replace advice given to you by your health care provider. Make sure you discuss any questions you have with your health care provider. ° °

## 2013-12-11 LAB — URINE CULTURE
Colony Count: NO GROWTH
Culture: NO GROWTH
SPECIAL REQUESTS: NORMAL

## 2014-05-09 ENCOUNTER — Emergency Department (HOSPITAL_COMMUNITY)
Admission: EM | Admit: 2014-05-09 | Discharge: 2014-05-09 | Disposition: A | Discharge status: Home / Self Care | Payer: Medicare Other | Attending: Emergency Medicine | Admitting: Emergency Medicine

## 2014-05-09 ENCOUNTER — Emergency Department (HOSPITAL_COMMUNITY): Payer: Medicare Other

## 2014-05-09 ENCOUNTER — Encounter (HOSPITAL_COMMUNITY): Payer: Self-pay

## 2014-05-09 DIAGNOSIS — M1711 Unilateral primary osteoarthritis, right knee: Secondary | ICD-10-CM | POA: Diagnosis not present

## 2014-05-09 DIAGNOSIS — Z9889 Other specified postprocedural states: Secondary | ICD-10-CM | POA: Insufficient documentation

## 2014-05-09 DIAGNOSIS — Z79899 Other long term (current) drug therapy: Secondary | ICD-10-CM | POA: Insufficient documentation

## 2014-05-09 DIAGNOSIS — Z8546 Personal history of malignant neoplasm of prostate: Secondary | ICD-10-CM | POA: Insufficient documentation

## 2014-05-09 DIAGNOSIS — F0390 Unspecified dementia without behavioral disturbance: Secondary | ICD-10-CM | POA: Insufficient documentation

## 2014-05-09 DIAGNOSIS — I10 Essential (primary) hypertension: Secondary | ICD-10-CM | POA: Diagnosis not present

## 2014-05-09 DIAGNOSIS — R05 Cough: Secondary | ICD-10-CM | POA: Diagnosis present

## 2014-05-09 DIAGNOSIS — Z7902 Long term (current) use of antithrombotics/antiplatelets: Secondary | ICD-10-CM | POA: Insufficient documentation

## 2014-05-09 DIAGNOSIS — J069 Acute upper respiratory infection, unspecified: Secondary | ICD-10-CM | POA: Diagnosis not present

## 2014-05-09 DIAGNOSIS — B349 Viral infection, unspecified: Secondary | ICD-10-CM | POA: Diagnosis not present

## 2014-05-09 LAB — COMPREHENSIVE METABOLIC PANEL
ALBUMIN: 3.4 g/dL — AB (ref 3.5–5.2)
ALK PHOS: 72 U/L (ref 39–117)
ALT: 11 U/L (ref 0–53)
ANION GAP: 7 (ref 5–15)
AST: 18 U/L (ref 0–37)
BILIRUBIN TOTAL: 0.8 mg/dL (ref 0.3–1.2)
BUN: 12 mg/dL (ref 6–23)
CHLORIDE: 104 meq/L (ref 96–112)
CO2: 29 mmol/L (ref 19–32)
Calcium: 8.6 mg/dL (ref 8.4–10.5)
Creatinine, Ser: 1.46 mg/dL — ABNORMAL HIGH (ref 0.50–1.35)
GFR calc non Af Amer: 43 mL/min — ABNORMAL LOW (ref >90)
GFR, EST AFRICAN AMERICAN: 50 mL/min — AB (ref >90)
GLUCOSE: 86 mg/dL (ref 70–99)
Potassium: 3.5 mmol/L (ref 3.5–5.1)
SODIUM: 140 mmol/L (ref 135–145)
TOTAL PROTEIN: 6.9 g/dL (ref 6.0–8.3)

## 2014-05-09 LAB — CBC WITH DIFFERENTIAL/PLATELET
Basophils Absolute: 0 10*3/uL (ref 0.0–0.1)
Basophils Relative: 0 % (ref 0–1)
Eosinophils Absolute: 0.7 10*3/uL (ref 0.0–0.7)
Eosinophils Relative: 10 % — ABNORMAL HIGH (ref 0–5)
HCT: 40.3 % (ref 39.0–52.0)
HEMOGLOBIN: 12.9 g/dL — AB (ref 13.0–17.0)
LYMPHS ABS: 1.7 10*3/uL (ref 0.7–4.0)
Lymphocytes Relative: 23 % (ref 12–46)
MCH: 26.4 pg (ref 26.0–34.0)
MCHC: 32 g/dL (ref 30.0–36.0)
MCV: 82.4 fL (ref 78.0–100.0)
MONOS PCT: 9 % (ref 3–12)
Monocytes Absolute: 0.7 10*3/uL (ref 0.1–1.0)
NEUTROS ABS: 4.1 10*3/uL (ref 1.7–7.7)
NEUTROS PCT: 58 % (ref 43–77)
PLATELETS: 144 10*3/uL — AB (ref 150–400)
RBC: 4.89 MIL/uL (ref 4.22–5.81)
RDW: 13.3 % (ref 11.5–15.5)
WBC: 7.2 10*3/uL (ref 4.0–10.5)

## 2014-05-09 LAB — URINALYSIS, ROUTINE W REFLEX MICROSCOPIC
BILIRUBIN URINE: NEGATIVE
GLUCOSE, UA: NEGATIVE mg/dL
Hgb urine dipstick: NEGATIVE
Ketones, ur: NEGATIVE mg/dL
Leukocytes, UA: NEGATIVE
Nitrite: NEGATIVE
Protein, ur: NEGATIVE mg/dL
SPECIFIC GRAVITY, URINE: 1.018 (ref 1.005–1.030)
UROBILINOGEN UA: 0.2 mg/dL (ref 0.0–1.0)
pH: 5.5 (ref 5.0–8.0)

## 2014-05-09 LAB — TROPONIN I: Troponin I: 0.03 ng/mL (ref <0.031)

## 2014-05-09 NOTE — ED Notes (Signed)
Patient transported to X-ray 

## 2014-05-09 NOTE — ED Notes (Signed)
Pt here for cough since Wednesday that is productive with thick grayish mucus. Having right knee pain without any injury. Has a hx of gout in that knee but states this doesn't feel like gout.

## 2014-05-09 NOTE — ED Provider Notes (Signed)
CSN: [id number]     Arrival date & time 05/09/14  0818 History   First MD Initiated Contact with Patient 05/09/14 0912     Chief Complaint  Patient presents with  . Knee Pain  . Cough     (Consider location/radiation/quality/duration/timing/severity/associated sxs/prior Treatment) Patient is a 80 y.o. male presenting with knee pain and cough. The history is provided by the patient. No language interpreter was used.  Knee Pain Associated symptoms: no fever   Cough Associated symptoms: chest pain   Associated symptoms: no chills, no fever, no headaches, no shortness of breath and no sore throat   Patrick [name] is an 80 y/o M with PMHx of prostate cancer, HTN, dementia presenting to the ED with cough that started Wednesday. Patient reported that when he coughs he produces a grayish color sputum when he coughs. Reported that he has been having nasal congestion as well. Wife reported that patient was seen by PCP on 05/04/2014 and diagnosed as having a cold viral in nature. Patient reported that he was prescribed a cough medicine that has not been working. Wife reported that he has been coughing mainly at night and not sleeping well. Patient reported that he started to have right knee pain that started on christmas morning. Stated that the pain is a pressure sensation localized to the right knee without radiation - only occurs when the patient applies pressure to the right knee. Stated that he does not experience pain when he rests. Wife reported that patient does have history of dementia, but stated that patient appears to be more confused - stated that he is different in his mannerism and behavior. Denied fever, chills, hemoptysis, sore throat, difficulty swallowing, abdominal pain, nausea, vomiting, diarrhea, melena, hematochezia,  decreased urine, hematuria, blurred vision, sudden loss of vision, headache, dizziness, neck pain, neck stiffness, numbness, tingling, loss of sensation, chest pain,  shortness of breath, difficulty breathing, fall, injury, changes to skin colored, leg swelling. PCP Dr. Jeanie Cooks  Past Medical History  Diagnosis Date  . Hypertension   . Hx of cardiac cath   . Cancer     prostate   Past Surgical History  Procedure Laterality Date  . Knee surgery     No family history on file. History  Substance Use Topics  . Smoking status: Never Smoker   . Smokeless tobacco: Not on file  . Alcohol Use: No    Review of Systems  Constitutional: Negative for fever and chills.  HENT: Positive for congestion. Negative for sore throat and trouble swallowing.   Eyes: Negative for visual disturbance.  Respiratory: Positive for cough. Negative for chest tightness and shortness of breath.   Cardiovascular: Positive for chest pain.  Gastrointestinal: Negative for nausea and abdominal pain.  Genitourinary: Negative for dysuria and decreased urine volume.  Musculoskeletal: Positive for arthralgias (right knee pain ).  Neurological: Negative for dizziness, weakness, numbness and headaches.      Allergies  Review of patient's allergies indicates no known allergies.  Home Medications   Prior to Admission medications   Medication Sig Start Date End Date Taking? Authorizing Provider  amLODipine (NORVASC) 5 MG tablet Take 5 mg by mouth daily.    Historical Provider, MD  clopidogrel (PLAVIX) 75 MG tablet Take 75 mg by mouth daily.    Historical Provider, MD  donepezil (ARICEPT) 10 MG tablet Take 10 mg by mouth at bedtime.    Historical Provider, MD  doxazosin (CARDURA) 8 MG tablet Take 4 mg by mouth daily.  Historical Provider, MD  finasteride (PROSCAR) 5 MG tablet Take 5 mg by mouth daily.    Historical Provider, MD  isosorbide mononitrate (IMDUR) 60 MG 24 hr tablet Take 60 mg by mouth daily.    Historical Provider, MD  metoprolol tartrate (LOPRESSOR) 25 MG tablet Take 25 mg by mouth 2 (two) times daily.    Historical Provider, MD   BP 172/108 mmHg  Pulse 54   Temp(Src) 98.5 F (36.9 C) (Oral)  Resp 16  SpO2 95% Physical Exam  Constitutional: He is oriented to person, place, and time. He appears well-developed and well-nourished. No distress.  HENT:  Head: Normocephalic and atraumatic.  Mouth/Throat: Oropharynx is clear and moist. No oropharyngeal exudate.  Eyes: Conjunctivae and EOM are normal. Pupils are equal, round, and reactive to light. Right eye exhibits no discharge. Left eye exhibits no discharge.  Neck: Normal range of motion. Neck supple. No tracheal deviation present.  Negative neck stiffness Negative nuchal rigidity Negative cervical lymphadenopathy Negative meningeal signs  Cardiovascular: Normal rate, regular rhythm and normal heart sounds.  Exam reveals no friction rub.   No murmur heard. Pulses:      Radial pulses are 2+ on the right side, and 2+ on the left side.       Dorsalis pedis pulses are 2+ on the right side, and 2+ on the left side.  Pulmonary/Chest: Effort normal and breath sounds normal. No respiratory distress. He has no wheezes. He has no rales. He exhibits no tenderness.  Patient is able to speak in full sentences without difficulty Negative use of accessory muscles Negative stridor  Musculoskeletal: He exhibits tenderness. He exhibits no edema.  Negative swelling, erythema, inflammation, lesions, sores, deformities, malalignments identified to the right knee. Negative warmth upon palpation. Mild discomfort upon palpation to the patellar tendon region. Discomfort with flexion extension, patient is able to do so. Full range of motion to the right ankle and digits of the right foot without difficulty.  Lymphadenopathy:    He has no cervical adenopathy.  Neurological: He is alert and oriented to person, place, and time. No cranial nerve deficit. He exhibits normal muscle tone. Coordination normal.  Cranial nerves III-XII grossly intact Strength 5+/5+ to upper and lower extremities bilaterally with resistance  applied, equal distribution noted Equal grip strength  Strength intact to digits of the feet bilaterally Sensation intact with differentiation to sharp and dull touch Negative facial droop Negative slurred speech Negative aphasia Patient follows commands well Patient response to questions appropriately Negative arm drift Fine motor skills intact   Skin: Skin is warm and dry. No rash noted. He is not diaphoretic. No erythema.  Psychiatric: He has a normal mood and affect. His behavior is normal. Thought content normal.  Nursing note and vitals reviewed.   ED Course  Procedures (including critical care time) Labs Review Labs Reviewed  CBC WITH DIFFERENTIAL - Abnormal; Notable for the following:    Hemoglobin 12.9 (*)    Platelets 144 (*)    Eosinophils Relative 10 (*)    All other components within normal limits  COMPREHENSIVE METABOLIC PANEL - Abnormal; Notable for the following:    Creatinine, Ser 1.46 (*)    Albumin 3.4 (*)    GFR calc non Af Amer 43 (*)    GFR calc Af Amer 50 (*)    All other components within normal limits  URINE CULTURE  TROPONIN I  URINALYSIS, ROUTINE W REFLEX MICROSCOPIC    Imaging Review Dg Chest 2 View  05/09/2014   CLINICAL DATA:  Cough productive of thick greatest mucus, history hypertension, prostate cancer  EXAM: CHEST  2 VIEW  COMPARISON:  01/04/2010  FINDINGS: Lordotic technique.  Low lung volumes.  Borderline enlargement of cardiac silhouette with tortuosity of thoracic aorta.  No gross infiltrate, pleural effusion or pneumothorax.  IMPRESSION: No acute abnormalities.   Electronically Signed   By: Lavonia Dana M.D.   On: 05/09/2014 10:56   Dg Knee Complete 4 Views Right  05/09/2014   CLINICAL DATA:  RIGHT knee pain, no injury, history gout, hypertension, prostate cancer  EXAM: RIGHT KNEE - COMPLETE 4+ VIEW  COMPARISON:  None  FINDINGS: Tricompartmental osteoarthritic changes with joint space narrowing and spur formation.  Small calcified  ossicle at medial joint line.  No acute fracture, dislocation or bone destruction.  Extensive atherosclerotic calcifications.  Prominent tibial tubercle spur.  Minimal joint effusion.  IMPRESSION: Advanced osteoarthritic changes RIGHT knee.   Electronically Signed   By: Lavonia Dana M.D.   On: 05/09/2014 10:55     EKG Interpretation   Date/Time:  Sunday May 09 2014 09:18:51 EST Ventricular Rate:  55 PR Interval:  169 QRS Duration: 84 QT Interval:  461 QTC Calculation: 441 R Axis:   32 Text Interpretation:  Sinus rhythm Borderline T abnormalities, inferior  leads When compared with ECG of 01/06/2010 No significant change was found  Confirmed by MCCMANUS  MD, KATHLEEN ([id number]) on 05/09/2014 12:12:07 PM       12 :54 PM This provider spoke with patient and wife. Wife reported that patient and the wife do not want the CT scan of the head to be performed. This provider had a long discussion with the patient and wife regarding initial interview where wife reported that she was concerned regarding increased changes to behavior and mentation. Wife reported that this has been ongoing for months and that it has not gotten worse and that she is not concerned. Discussed case with attending physician, Dr. Towanda Malkin - recommended CT head to be discontinued.   12:58 Patient and wife ready to go home.   MDM   Final diagnoses:  URI (upper respiratory infection)  Viral illness  Osteoarthritis of right knee, unspecified osteoarthritis type    Medications - No data to display  Filed Vitals:   05/09/14 0831 05/09/14 0930 05/09/14 1145 05/09/14 1302  BP: 171/94 158/97 168/96 172/108  Pulse: 63 53 52 54  Temp: 98.5 F (36.9 C)     TempSrc: Oral     Resp: 18 14 14 16   SpO2: 94% 93% 93% 95%   EKG noted sinus rhythm with a heart rate of 55 bpm-borderline T abnormalities-can change since last tracing. Troponin negative elevation. CBC negative elevated white blood cell count-negative left shift.  Hemoglobin 12.9, hematocrit 40.3. CMP unremarkable-mildly elevated creatinine of 1.46, in the past patient has been as high as 1.83. Urine negative for infection-negative nitrites or leukocytes. Plain film of right knee noted advanced osteoarthritic changes. Chest x-ray negative for acute cardiopulmonary disease.  Negative findings of pneumonia. Suspicion to be upper respiratory infection, viral in nature. Doubt septic joint. Doubt compartment syndrome. Doubt gout in the knee. Suspicion to be osteoarthritis is noted on plain film-patient placed in knee sleeve for comfort. Wife reported and confirmed that patient is at baseline and that there has been no increase in confusion - reported that patient is stable. Wife would like to bring patient home and patient would like to go home. Negative findings of UTI. Negative  findings of pyelonephritis. Discussed case in great detail along with results with attending physician who agrees to plan of care - agreed to plan of discharge. Patient stable, afebrile. Patient not septic appearing. Discharged patient. Discussed with patient and wife to follow up with PCP. discissed with patient and wife that Creatinine needs to be re-checked within 24-48 hours. Referred to orthopedics for osteoarthritis in the right knee. Discussed with patient to rest and stay hydrated. Discussed with patient to closely monitor symptoms and if symptoms are to worsen or change to report back to the ED - strict return instructions given.  Patient agreed to plan of care, understood, all questions answered.   Jamse Mead, PA-C 05/09/14 [id number] PA-C 05/09/14 [place], DO 05/12/14 1036

## 2014-05-09 NOTE — Discharge Instructions (Signed)
Please call your doctor for a followup appointment within 24-48 hours. When you talk to your doctor please let them know that you were seen in the emergency department and have them acquire all of your records so that they can discuss the findings with you and formulate a treatment plan to fully care for your new and ongoing problems. Please call and set-up an appointment with your primary care provider Please rest and stay hydrated Please avoid any physical or strenuous activity  Please continue to use medications as prescribed Please continue to apply knee sleeve  Please have Creatinine re-checked by primary doctor within 24 - 48 hours Please continue to monitor symptoms closely and if symptoms are to worsen or change (fever greater than 101, chills, sweating, nausea, vomiting, chest pain, shortness of breathe, difficulty breathing, weakness, numbness, tingling, worsening or changes to pain pattern, fall, injury, changes to mental state, dizziness, headaches, stomach pain) please report back to the Emergency Department immediately.   Upper Respiratory Infection, Adult An upper respiratory infection (URI) is also sometimes known as the common cold. The upper respiratory tract includes the nose, sinuses, throat, trachea, and bronchi. Bronchi are the airways leading to the lungs. Most people improve within 1 week, but symptoms can last up to 2 weeks. A residual cough may last even longer.  CAUSES Many different viruses can infect the tissues lining the upper respiratory tract. The tissues become irritated and inflamed and often become very moist. Mucus production is also common. A cold is contagious. You can easily spread the virus to others by oral contact. This includes kissing, sharing a glass, coughing, or sneezing. Touching your mouth or nose and then touching a surface, which is then touched by another person, can also spread the virus. SYMPTOMS  Symptoms typically develop 1 to 3 days after you  come in contact with a cold virus. Symptoms vary from person to person. They may include:  Runny nose.  Sneezing.  Nasal congestion.  Sinus irritation.  Sore throat.  Loss of voice (laryngitis).  Cough.  Fatigue.  Muscle aches.  Loss of appetite.  Headache.  Low-grade fever. DIAGNOSIS  You might diagnose your own cold based on familiar symptoms, since most people get a cold 2 to 3 times a year. Your caregiver can confirm this based on your exam. Most importantly, your caregiver can check that your symptoms are not due to another disease such as strep throat, sinusitis, pneumonia, asthma, or epiglottitis. Blood tests, throat tests, and X-rays are not necessary to diagnose a common cold, but they may sometimes be helpful in excluding other more serious diseases. Your caregiver will decide if any further tests are required. RISKS AND COMPLICATIONS  You may be at risk for a more severe case of the common cold if you smoke cigarettes, have chronic heart disease (such as heart failure) or lung disease (such as asthma), or if you have a weakened immune system. The very young and very old are also at risk for more serious infections. Bacterial sinusitis, middle ear infections, and bacterial pneumonia can complicate the common cold. The common cold can worsen asthma and chronic obstructive pulmonary disease (COPD). Sometimes, these complications can require emergency medical care and may be life-threatening. PREVENTION  The best way to protect against getting a cold is to practice good hygiene. Avoid oral or hand contact with people with cold symptoms. Wash your hands often if contact occurs. There is no clear evidence that vitamin C, vitamin E, echinacea, or exercise reduces  the chance of developing a cold. However, it is always recommended to get plenty of rest and practice good nutrition. TREATMENT  Treatment is directed at relieving symptoms. There is no cure. Antibiotics are not  effective, because the infection is caused by a virus, not by bacteria. Treatment may include:  Increased fluid intake. Sports drinks offer valuable electrolytes, sugars, and fluids.  Breathing heated mist or steam (vaporizer or shower).  Eating chicken soup or other clear broths, and maintaining good nutrition.  Getting plenty of rest.  Using gargles or lozenges for comfort.  Controlling fevers with ibuprofen or acetaminophen as directed by your caregiver.  Increasing usage of your inhaler if you have asthma. Zinc gel and zinc lozenges, taken in the first 24 hours of the common cold, can shorten the duration and lessen the severity of symptoms. Pain medicines may help with fever, muscle aches, and throat pain. A variety of non-prescription medicines are available to treat congestion and runny nose. Your caregiver can make recommendations and may suggest nasal or lung inhalers for other symptoms.  HOME CARE INSTRUCTIONS   Only take over-the-counter or prescription medicines for pain, discomfort, or fever as directed by your caregiver.  Use a warm mist humidifier or inhale steam from a shower to increase air moisture. This may keep secretions moist and make it easier to breathe.  Drink enough water and fluids to keep your urine clear or pale yellow.  Rest as needed.  Return to work when your temperature has returned to normal or as your caregiver advises. You may need to stay home longer to avoid infecting others. You can also use a face mask and careful hand washing to prevent spread of the virus. SEEK MEDICAL CARE IF:   After the first few days, you feel you are getting worse rather than better.  You need your caregiver's advice about medicines to control symptoms.  You develop chills, worsening shortness of breath, or brown or red sputum. These may be signs of pneumonia.  You develop yellow or brown nasal discharge or pain in the face, especially when you bend forward. These may  be signs of sinusitis.  You develop a fever, swollen neck glands, pain with swallowing, or white areas in the back of your throat. These may be signs of strep throat. SEEK IMMEDIATE MEDICAL CARE IF:   You have a fever.  You develop severe or persistent headache, ear pain, sinus pain, or chest pain.  You develop wheezing, a prolonged cough, cough up blood, or have a change in your usual mucus (if you have chronic lung disease).  You develop sore muscles or a stiff neck. Document Released: 10/24/2000 Document Revised: 07/23/2011 Document Reviewed: 08/05/2013 [facility] Patient Information 2015 [place], [place]. This information is not intended to replace advice given to you by your health care provider. Make sure you discuss any questions you have with your health care provider.  Osteoarthritis Osteoarthritis is a disease that causes soreness and inflammation of a joint. It occurs when the cartilage at the affected joint wears down. Cartilage acts as a cushion, covering the ends of bones where they meet to form a joint. Osteoarthritis is the most common form of arthritis. It often occurs in older people. The joints affected most often by this condition include those in the:  Ends of the fingers.  Thumbs.  Neck.  Lower back.  Knees.  Hips. CAUSES  Over time, the cartilage that covers the ends of bones begins to wear away. This causes bone  to rub on bone, producing pain and stiffness in the affected joints.  RISK FACTORS Certain factors can increase your chances of having osteoarthritis, including:  Older age.  Excessive body weight.  Overuse of joints.  Previous joint injury. SIGNS AND SYMPTOMS   Pain, swelling, and stiffness in the joint.  Over time, the joint may lose its normal shape.  Small deposits of bone (osteophytes) may grow on the edges of the joint.  Bits of bone or cartilage can break off and float inside the joint space. This may cause more pain and  damage. DIAGNOSIS  Your health care provider will do a physical exam and ask about your symptoms. Various tests may be ordered, such as:  X-rays of the affected joint.  An MRI scan.  Blood tests to rule out other types of arthritis.  Joint fluid tests. This involves using a needle to draw fluid from the joint and examining the fluid under a microscope. TREATMENT  Goals of treatment are to control pain and improve joint function. Treatment plans may include:  A prescribed exercise program that allows for rest and joint relief.  A weight control plan.  Pain relief techniques, such as:  Properly applied heat and cold.  Electric pulses delivered to nerve endings under the skin (transcutaneous electrical nerve stimulation [TENS]).  Massage.  Certain nutritional supplements.  Medicines to control pain, such as:  Acetaminophen.  Nonsteroidal anti-inflammatory drugs (NSAIDs), such as naproxen.  Narcotic or central-acting agents, such as tramadol.  Corticosteroids. These can be given orally or as an injection.  Surgery to reposition the bones and relieve pain (osteotomy) or to remove loose pieces of bone and cartilage. Joint replacement may be needed in advanced states of osteoarthritis. HOME CARE INSTRUCTIONS   Take medicines only as directed by your health care provider.  Maintain a healthy weight. Follow your health care provider's instructions for weight control. This may include dietary instructions.  Exercise as directed. Your health care provider can recommend specific types of exercise. These may include:  Strengthening exercises. These are done to strengthen the muscles that support joints affected by arthritis. They can be performed with weights or with exercise bands to add resistance.  Aerobic activities. These are exercises, such as brisk walking or low-impact aerobics, that get your heart pumping.  Range-of-motion activities. These keep your joints  limber.  Balance and agility exercises. These help you maintain daily living skills.  Rest your affected joints as directed by your health care provider.  Keep all follow-up visits as directed by your health care provider. SEEK MEDICAL CARE IF:   Your skin turns red.  You develop a rash in addition to your joint pain.  You have worsening joint pain.  You have a fever along with joint or muscle aches. SEEK IMMEDIATE MEDICAL CARE IF:  You have a significant loss of weight or appetite.  You have night sweats. [place] of Arthritis and Musculoskeletal and Skin Diseases: www.niams.SouthExposed.es  [employer] on Aging: http://kim-miller.com/  American College of Rheumatology: www.rheumatology.org Document Released: 04/30/2005 Document Revised: 09/14/2013 Document Reviewed: 01/05/2013 [facility] Patient Information 2015 [place], [place]. This information is not intended to replace advice given to you by your health care provider. Make sure you discuss any questions you have with your health care provider.

## 2014-05-10 LAB — URINE CULTURE
COLONY COUNT: NO GROWTH
CULTURE: NO GROWTH

## 2014-06-01 DIAGNOSIS — I10 Essential (primary) hypertension: Secondary | ICD-10-CM | POA: Diagnosis not present

## 2014-06-01 DIAGNOSIS — M47812 Spondylosis without myelopathy or radiculopathy, cervical region: Secondary | ICD-10-CM | POA: Diagnosis not present

## 2014-06-01 DIAGNOSIS — R7301 Impaired fasting glucose: Secondary | ICD-10-CM | POA: Diagnosis not present

## 2014-06-01 DIAGNOSIS — I6789 Other cerebrovascular disease: Secondary | ICD-10-CM | POA: Diagnosis not present

## 2014-06-09 DIAGNOSIS — C61 Malignant neoplasm of prostate: Secondary | ICD-10-CM | POA: Diagnosis not present

## 2014-06-16 DIAGNOSIS — C61 Malignant neoplasm of prostate: Secondary | ICD-10-CM | POA: Diagnosis not present

## 2014-06-16 DIAGNOSIS — R3916 Straining to void: Secondary | ICD-10-CM | POA: Diagnosis not present

## 2014-06-16 DIAGNOSIS — N401 Enlarged prostate with lower urinary tract symptoms: Secondary | ICD-10-CM | POA: Diagnosis not present

## 2014-08-03 DIAGNOSIS — I2510 Atherosclerotic heart disease of native coronary artery without angina pectoris: Secondary | ICD-10-CM | POA: Diagnosis not present

## 2014-08-03 DIAGNOSIS — G309 Alzheimer's disease, unspecified: Secondary | ICD-10-CM | POA: Diagnosis not present

## 2014-08-03 DIAGNOSIS — I10 Essential (primary) hypertension: Secondary | ICD-10-CM | POA: Diagnosis not present

## 2014-08-03 DIAGNOSIS — E119 Type 2 diabetes mellitus without complications: Secondary | ICD-10-CM | POA: Diagnosis not present

## 2014-08-03 DIAGNOSIS — M47812 Spondylosis without myelopathy or radiculopathy, cervical region: Secondary | ICD-10-CM | POA: Diagnosis not present

## 2014-08-13 DIAGNOSIS — M47812 Spondylosis without myelopathy or radiculopathy, cervical region: Secondary | ICD-10-CM | POA: Diagnosis not present

## 2014-08-13 DIAGNOSIS — G43909 Migraine, unspecified, not intractable, without status migrainosus: Secondary | ICD-10-CM | POA: Diagnosis not present

## 2014-08-13 DIAGNOSIS — M5031 Other cervical disc degeneration,  high cervical region: Secondary | ICD-10-CM | POA: Diagnosis not present

## 2014-10-31 ENCOUNTER — Emergency Department (HOSPITAL_COMMUNITY)
Admission: EM | Admit: 2014-10-31 | Discharge: 2014-11-01 | Disposition: A | Discharge status: Home / Self Care | Payer: Medicare Other | Attending: Emergency Medicine | Admitting: Emergency Medicine

## 2014-10-31 ENCOUNTER — Encounter (HOSPITAL_COMMUNITY): Payer: Self-pay | Admitting: *Deleted

## 2014-10-31 DIAGNOSIS — R05 Cough: Secondary | ICD-10-CM | POA: Insufficient documentation

## 2014-10-31 DIAGNOSIS — G309 Alzheimer's disease, unspecified: Secondary | ICD-10-CM | POA: Diagnosis not present

## 2014-10-31 DIAGNOSIS — Z79899 Other long term (current) drug therapy: Secondary | ICD-10-CM | POA: Insufficient documentation

## 2014-10-31 DIAGNOSIS — I2510 Atherosclerotic heart disease of native coronary artery without angina pectoris: Secondary | ICD-10-CM | POA: Insufficient documentation

## 2014-10-31 DIAGNOSIS — I10 Essential (primary) hypertension: Secondary | ICD-10-CM | POA: Insufficient documentation

## 2014-10-31 DIAGNOSIS — R509 Fever, unspecified: Secondary | ICD-10-CM | POA: Diagnosis not present

## 2014-10-31 DIAGNOSIS — Z7902 Long term (current) use of antithrombotics/antiplatelets: Secondary | ICD-10-CM | POA: Diagnosis not present

## 2014-10-31 DIAGNOSIS — R059 Cough, unspecified: Secondary | ICD-10-CM

## 2014-10-31 DIAGNOSIS — R062 Wheezing: Secondary | ICD-10-CM | POA: Insufficient documentation

## 2014-10-31 DIAGNOSIS — Z9889 Other specified postprocedural states: Secondary | ICD-10-CM | POA: Insufficient documentation

## 2014-10-31 DIAGNOSIS — Z8546 Personal history of malignant neoplasm of prostate: Secondary | ICD-10-CM | POA: Diagnosis not present

## 2014-10-31 NOTE — ED Provider Notes (Signed)
CSN: [id number]     Arrival date & time 10/31/14  2306 History  This chart was scribed for Debby Freiberg, MD by Randa Evens, ED Scribe. This patient was seen in room B14C/B14C and the patient's care was started at 11:28 PM.      Chief Complaint  Patient presents with  . Cough   HPI HPI Comments: Patrick [name] is a 81 y.o. male with PMHx alzheimer's, CAD and HTN who presents to the Emergency Department complaining of intermittent new productive cough of grey sputum with streaks of blood present onset 3 days prior. Pt presents with low grade fever and wheezing. Pt family doesn't report any treatments tried PTA. Denies rhinorrhea, sneezing or n/v/d.   Past Medical History  Diagnosis Date  . Hypertension   . Hx of cardiac cath   . Cancer     prostate   Past Surgical History  Procedure Laterality Date  . Knee surgery     No family history on file. History  Substance Use Topics  . Smoking status: Never Smoker   . Smokeless tobacco: Not on file  . Alcohol Use: No    Review of Systems  Constitutional: Positive for fever. Negative for chills.  HENT: Negative for rhinorrhea and sneezing.   Respiratory: Positive for cough and wheezing.   Gastrointestinal: Negative for nausea, vomiting and diarrhea.  All other systems reviewed and are negative.    Allergies  Review of patient's allergies indicates no known allergies.  Home Medications   Prior to Admission medications   Medication Sig Start Date End Date Taking? Authorizing Provider  amLODipine (NORVASC) 5 MG tablet Take 5 mg by mouth daily.   Yes Historical Provider, MD  clopidogrel (PLAVIX) 75 MG tablet Take 75 mg by mouth daily.   Yes Historical Provider, MD  donepezil (ARICEPT) 10 MG tablet Take 10 mg by mouth at bedtime.   Yes Historical Provider, MD  doxazosin (CARDURA) 8 MG tablet Take 4 mg by mouth daily.   Yes Historical Provider, MD  finasteride (PROSCAR) 5 MG tablet Take 5 mg by mouth daily.   Yes Historical  Provider, MD  isosorbide mononitrate (IMDUR) 60 MG 24 hr tablet Take 60 mg by mouth daily.   Yes Historical Provider, MD  metoprolol tartrate (LOPRESSOR) 25 MG tablet Take 25 mg by mouth 2 (two) times daily.   Yes Historical Provider, MD  Multiple Vitamin (MULTIVITAMIN WITH MINERALS) TABS tablet Take 1 tablet by mouth daily.   Yes Historical Provider, MD  azithromycin (ZITHROMAX) 250 MG tablet Take 1 tablet (250 mg total) by mouth daily. 11/01/14   Debby Freiberg, MD  chlorpheniramine-HYDROcodone [facility] PENNKINETIC ER) 10-8 MG/5ML SUER Take 5 mLs by mouth every 12 (twelve) hours as needed for cough. 11/01/14   Debby Freiberg, MD   BP 141/77 mmHg  Pulse 78  Temp(Src) 100.1 F (37.8 C) (Oral)  Resp 26  Ht 5\' 7"  (1.702 m)  SpO2 92%   Physical Exam  Constitutional: He is oriented to person, place, and time. He appears well-developed and well-nourished.  HENT:  Head: Normocephalic and atraumatic.  Eyes: Conjunctivae and EOM are normal.  Neck: Normal range of motion. Neck supple.  Cardiovascular: Normal rate, regular rhythm and normal heart sounds.   Pulmonary/Chest: Effort normal. No respiratory distress. He has wheezes (diffuse end expiratory).  Abdominal: He exhibits no distension. There is no tenderness. There is no rebound and no guarding.  Musculoskeletal: Normal range of motion.  Neurological: He is alert and oriented to  person, place, and time.  Skin: Skin is warm and dry.  Vitals reviewed.   ED Course  Procedures (including critical care time) DIAGNOSTIC STUDIES: Oxygen Saturation is 94% on RA, adequate by my interpretation.    COORDINATION OF CARE: 11:39 PM-Discussed treatment plan with family at bedside and family agreed to plan.     Labs Review Labs Reviewed  CBC WITH DIFFERENTIAL/PLATELET - Abnormal; Notable for the following:    Hemoglobin 11.5 (*)    HCT 35.5 (*)    Monocytes Relative 14 (*)    Monocytes Absolute 1.1 (*)    Eosinophils Relative 7 (*)    All  other components within normal limits  BASIC METABOLIC PANEL - Abnormal; Notable for the following:    Potassium 3.1 (*)    Chloride 100 (*)    Glucose, Bld 115 (*)    Creatinine, Ser 1.61 (*)    Calcium 8.2 (*)    GFR calc non Af Amer 38 (*)    GFR calc Af Amer 44 (*)    All other components within normal limits    Imaging Review Dg Chest 2 View  11/01/2014   CLINICAL DATA:  Acute onset of dry cough.  Initial encounter.  EXAM: CHEST  2 VIEW  COMPARISON:  Chest radiograph performed 05/09/2014  FINDINGS: The lungs are hypoexpanded. Mild bibasilar opacities may reflect atelectasis or mild pneumonia. There is no evidence of pleural effusion or pneumothorax.  The heart is normal in size; the mediastinal contour is within normal limits. No acute osseous abnormalities are seen.  IMPRESSION: Lungs hypoexpanded. Mild bibasilar opacities may reflect atelectasis or mild pneumonia.   Electronically Signed   By: Garald Balding M.D.   On: 11/01/2014 01:07     EKG Interpretation   Date/Time:  Monday November 01 2014 00:41:00 EDT Ventricular Rate:  78 PR Interval:  161 QRS Duration: 83 QT Interval:  368 QTC Calculation: 419 R Axis:   40 Text Interpretation:  Sinus rhythm Atrial premature complexes No  significant change since last tracing Confirmed by Debby Freiberg [id number]  on 11/01/2014 1:13:06 AM      MDM   Final diagnoses:  Cough       81 y.o. male with pertinent PMH of prostate cancer, cad, HTN, alzheimers presents with cough x 3 days with subj fever.  Also has had blood streaking in cough, however this is always after vigorous coughing, not frank hemoptysis.  He coughed during his stay without hemoptysis.  Doubt PE or other emergent pathology given appearance and history. On arrival vitals and physical exam as above.  No tachycardia or dyspnea.    Wu as above, CXR with bibasilar atelectasis with ? PNA.  Given fever and alzheimers, will treat empirically with azithromycin.  DC home in  stable condition  I have reviewed all laboratory and imaging studies if ordered as above  1. Cough           Debby Freiberg, MD 11/01/14 [phone]

## 2014-10-31 NOTE — ED Notes (Signed)
The pt has had cold symptoms since Thursday and he has a productive cough thick    White.  Maybe low-grade temp.  Pain in his ribs when he coughs

## 2014-11-01 ENCOUNTER — Emergency Department (HOSPITAL_COMMUNITY): Payer: Medicare Other

## 2014-11-01 DIAGNOSIS — R05 Cough: Secondary | ICD-10-CM | POA: Diagnosis not present

## 2014-11-01 LAB — CBC WITH DIFFERENTIAL/PLATELET
BASOS ABS: 0 10*3/uL (ref 0.0–0.1)
Basophils Relative: 0 % (ref 0–1)
Eosinophils Absolute: 0.5 10*3/uL (ref 0.0–0.7)
Eosinophils Relative: 7 % — ABNORMAL HIGH (ref 0–5)
HCT: 35.5 % — ABNORMAL LOW (ref 39.0–52.0)
Hemoglobin: 11.5 g/dL — ABNORMAL LOW (ref 13.0–17.0)
LYMPHS PCT: 26 % (ref 12–46)
Lymphs Abs: 2 10*3/uL (ref 0.7–4.0)
MCH: 26.3 pg (ref 26.0–34.0)
MCHC: 32.4 g/dL (ref 30.0–36.0)
MCV: 81.2 fL (ref 78.0–100.0)
Monocytes Absolute: 1.1 10*3/uL — ABNORMAL HIGH (ref 0.1–1.0)
Monocytes Relative: 14 % — ABNORMAL HIGH (ref 3–12)
NEUTROS ABS: 4 10*3/uL (ref 1.7–7.7)
Neutrophils Relative %: 52 % (ref 43–77)
PLATELETS: 176 10*3/uL (ref 150–400)
RBC: 4.37 MIL/uL (ref 4.22–5.81)
RDW: 13.6 % (ref 11.5–15.5)
WBC: 7.6 10*3/uL (ref 4.0–10.5)

## 2014-11-01 LAB — BASIC METABOLIC PANEL
ANION GAP: 8 (ref 5–15)
BUN: 11 mg/dL (ref 6–20)
CO2: 30 mmol/L (ref 22–32)
Calcium: 8.2 mg/dL — ABNORMAL LOW (ref 8.9–10.3)
Chloride: 100 mmol/L — ABNORMAL LOW (ref 101–111)
Creatinine, Ser: 1.61 mg/dL — ABNORMAL HIGH (ref 0.61–1.24)
GFR calc Af Amer: 44 mL/min — ABNORMAL LOW (ref >60)
GFR, EST NON AFRICAN AMERICAN: 38 mL/min — AB (ref >60)
Glucose, Bld: 115 mg/dL — ABNORMAL HIGH (ref 65–99)
POTASSIUM: 3.1 mmol/L — AB (ref 3.5–5.1)
Sodium: 138 mmol/L (ref 135–145)

## 2014-11-01 MED ORDER — AZITHROMYCIN 250 MG PO TABS
500.0000 mg | ORAL_TABLET | Freq: Once | ORAL | Status: AC
  Administered 2014-11-01: 500 mg via ORAL
  Filled 2014-11-01: qty 2

## 2014-11-01 MED ORDER — HYDROCOD POLST-CPM POLST ER 10-8 MG/5ML PO SUER
5.0000 mL | Freq: Once | ORAL | Status: AC
  Administered 2014-11-01: 5 mL via ORAL
  Filled 2014-11-01: qty 5

## 2014-11-01 MED ORDER — ACETAMINOPHEN 500 MG PO TABS
1000.0000 mg | ORAL_TABLET | Freq: Once | ORAL | Status: AC
  Administered 2014-11-01: 1000 mg via ORAL
  Filled 2014-11-01: qty 2

## 2014-11-01 MED ORDER — HYDROCOD POLST-CPM POLST ER 10-8 MG/5ML PO SUER
5.0000 mL | Freq: Two times a day (BID) | ORAL | Status: DC | PRN
Start: 2014-11-01 — End: 2014-11-13

## 2014-11-01 MED ORDER — AZITHROMYCIN 250 MG PO TABS: 250.0000 mg | ORAL_TABLET | Freq: Every day | ORAL | Status: DC

## 2014-11-01 NOTE — ED Notes (Signed)
MD notified of temp 100.39F

## 2014-11-01 NOTE — Discharge Instructions (Signed)
Cough, Adult  A cough is a reflex that helps clear your throat and airways. It can help heal the body or may be a reaction to an irritated airway. A cough may only last 2 or 3 weeks (acute) or may last more than 8 weeks (chronic).  CAUSES Acute cough:  Viral or bacterial infections. Chronic cough:  Infections.  Allergies.  Asthma.  Post-nasal drip.  Smoking.  Heartburn or acid reflux.  Some medicines.  Chronic lung problems (COPD).  Cancer. SYMPTOMS   Cough.  Fever.  Chest pain.  Increased breathing rate.  High-pitched whistling sound when breathing (wheezing).  Colored mucus that you cough up (sputum). TREATMENT   A bacterial cough may be treated with antibiotic medicine.  A viral cough must run its course and will not respond to antibiotics.  Your caregiver may recommend other treatments if you have a chronic cough. HOME CARE INSTRUCTIONS   Only take over-the-counter or prescription medicines for pain, discomfort, or fever as directed by your caregiver. Use cough suppressants only as directed by your caregiver.  Use a cold steam vaporizer or humidifier in your bedroom or home to help loosen secretions.  Sleep in a semi-upright position if your cough is worse at night.  Rest as needed.  Stop smoking if you smoke. SEEK IMMEDIATE MEDICAL CARE IF:   You have pus in your sputum.  Your cough starts to worsen.  You cannot control your cough with suppressants and are losing sleep.  You begin coughing up blood.  You have difficulty breathing.  You develop pain which is getting worse or is uncontrolled with medicine.  You have a fever. MAKE SURE YOU:   Understand these instructions.  Will watch your condition.  Will get help right away if you are not doing well or get worse. Document Released: 10/27/2010 Document Revised: 07/23/2011 Document Reviewed: 10/27/2010 [employer] Patient Information 2015 [employer]. This information is not intended  to replace advice given to you by your health care provider. Make sure you discuss any questions you have with your health care provider.  

## 2014-11-13 ENCOUNTER — Emergency Department (HOSPITAL_COMMUNITY): Payer: Medicare Other

## 2014-11-13 ENCOUNTER — Inpatient Hospital Stay (HOSPITAL_COMMUNITY)
Admission: EM | Admit: 2014-11-13 | Discharge: 2014-11-14 | DRG: 558 | Disposition: A | Discharge status: Home / Self Care | Payer: Medicare Other | Attending: Internal Medicine | Admitting: Internal Medicine

## 2014-11-13 ENCOUNTER — Encounter (HOSPITAL_COMMUNITY): Payer: Self-pay | Admitting: *Deleted

## 2014-11-13 DIAGNOSIS — M1711 Unilateral primary osteoarthritis, right knee: Secondary | ICD-10-CM | POA: Diagnosis not present

## 2014-11-13 DIAGNOSIS — M109 Gout, unspecified: Secondary | ICD-10-CM | POA: Diagnosis not present

## 2014-11-13 DIAGNOSIS — Z7902 Long term (current) use of antithrombotics/antiplatelets: Secondary | ICD-10-CM | POA: Diagnosis not present

## 2014-11-13 DIAGNOSIS — I2510 Atherosclerotic heart disease of native coronary artery without angina pectoris: Secondary | ICD-10-CM | POA: Diagnosis not present

## 2014-11-13 DIAGNOSIS — N400 Enlarged prostate without lower urinary tract symptoms: Secondary | ICD-10-CM | POA: Diagnosis present

## 2014-11-13 DIAGNOSIS — M10061 Idiopathic gout, right knee: Secondary | ICD-10-CM

## 2014-11-13 DIAGNOSIS — M179 Osteoarthritis of knee, unspecified: Secondary | ICD-10-CM | POA: Diagnosis not present

## 2014-11-13 DIAGNOSIS — R531 Weakness: Secondary | ICD-10-CM

## 2014-11-13 DIAGNOSIS — Z955 Presence of coronary angioplasty implant and graft: Secondary | ICD-10-CM

## 2014-11-13 DIAGNOSIS — M1710 Unilateral primary osteoarthritis, unspecified knee: Secondary | ICD-10-CM | POA: Diagnosis present

## 2014-11-13 DIAGNOSIS — M25461 Effusion, right knee: Secondary | ICD-10-CM | POA: Diagnosis present

## 2014-11-13 DIAGNOSIS — R630 Anorexia: Secondary | ICD-10-CM | POA: Diagnosis not present

## 2014-11-13 DIAGNOSIS — Z79899 Other long term (current) drug therapy: Secondary | ICD-10-CM

## 2014-11-13 DIAGNOSIS — M25561 Pain in right knee: Secondary | ICD-10-CM | POA: Diagnosis present

## 2014-11-13 DIAGNOSIS — Z8546 Personal history of malignant neoplasm of prostate: Secondary | ICD-10-CM

## 2014-11-13 DIAGNOSIS — Z87891 Personal history of nicotine dependence: Secondary | ICD-10-CM | POA: Diagnosis not present

## 2014-11-13 DIAGNOSIS — M71161 Other infective bursitis, right knee: Secondary | ICD-10-CM | POA: Insufficient documentation

## 2014-11-13 DIAGNOSIS — M7051 Other bursitis of knee, right knee: Secondary | ICD-10-CM | POA: Diagnosis present

## 2014-11-13 DIAGNOSIS — M7041 Prepatellar bursitis, right knee: Principal | ICD-10-CM | POA: Diagnosis present

## 2014-11-13 DIAGNOSIS — F0280 Dementia in other diseases classified elsewhere without behavioral disturbance: Secondary | ICD-10-CM | POA: Diagnosis present

## 2014-11-13 DIAGNOSIS — E876 Hypokalemia: Secondary | ICD-10-CM | POA: Diagnosis not present

## 2014-11-13 DIAGNOSIS — Z66 Do not resuscitate: Secondary | ICD-10-CM | POA: Diagnosis not present

## 2014-11-13 DIAGNOSIS — I129 Hypertensive chronic kidney disease with stage 1 through stage 4 chronic kidney disease, or unspecified chronic kidney disease: Secondary | ICD-10-CM | POA: Diagnosis not present

## 2014-11-13 DIAGNOSIS — M71561 Other bursitis, not elsewhere classified, right knee: Secondary | ICD-10-CM | POA: Diagnosis not present

## 2014-11-13 DIAGNOSIS — N183 Chronic kidney disease, stage 3 unspecified: Secondary | ICD-10-CM | POA: Diagnosis present

## 2014-11-13 DIAGNOSIS — I25810 Atherosclerosis of coronary artery bypass graft(s) without angina pectoris: Secondary | ICD-10-CM | POA: Diagnosis present

## 2014-11-13 DIAGNOSIS — Z951 Presence of aortocoronary bypass graft: Secondary | ICD-10-CM

## 2014-11-13 DIAGNOSIS — G309 Alzheimer's disease, unspecified: Secondary | ICD-10-CM | POA: Diagnosis not present

## 2014-11-13 DIAGNOSIS — I10 Essential (primary) hypertension: Secondary | ICD-10-CM | POA: Diagnosis not present

## 2014-11-13 HISTORY — DX: Alzheimer's disease, unspecified: G30.9

## 2014-11-13 HISTORY — DX: Atherosclerotic heart disease of native coronary artery without angina pectoris: I25.10

## 2014-11-13 HISTORY — DX: Essential (primary) hypertension: I10

## 2014-11-13 HISTORY — DX: Unilateral primary osteoarthritis, unspecified knee: M17.10

## 2014-11-13 HISTORY — DX: Gout, unspecified: M10.9

## 2014-11-13 HISTORY — DX: Dementia in other diseases classified elsewhere, unspecified severity, without behavioral disturbance, psychotic disturbance, mood disturbance, and anxiety: F02.80

## 2014-11-13 LAB — COMPREHENSIVE METABOLIC PANEL
ALK PHOS: 60 U/L (ref 38–126)
ALT: 17 U/L (ref 17–63)
AST: 17 U/L (ref 15–41)
Albumin: 2.7 g/dL — ABNORMAL LOW (ref 3.5–5.0)
Anion gap: 11 (ref 5–15)
BUN: 11 mg/dL (ref 6–20)
CALCIUM: 8.4 mg/dL — AB (ref 8.9–10.3)
CO2: 28 mmol/L (ref 22–32)
Chloride: 102 mmol/L (ref 101–111)
Creatinine, Ser: 1.62 mg/dL — ABNORMAL HIGH (ref 0.61–1.24)
GFR calc Af Amer: 44 mL/min — ABNORMAL LOW (ref >60)
GFR calc non Af Amer: 38 mL/min — ABNORMAL LOW (ref >60)
GLUCOSE: 95 mg/dL (ref 65–99)
Potassium: 3.2 mmol/L — ABNORMAL LOW (ref 3.5–5.1)
Sodium: 141 mmol/L (ref 135–145)
TOTAL PROTEIN: 6.7 g/dL (ref 6.5–8.1)
Total Bilirubin: 0.7 mg/dL (ref 0.3–1.2)

## 2014-11-13 LAB — CBC WITH DIFFERENTIAL/PLATELET
Basophils Absolute: 0 10*3/uL (ref 0.0–0.1)
Basophils Relative: 0 % (ref 0–1)
EOS PCT: 3 % (ref 0–5)
Eosinophils Absolute: 0.3 10*3/uL (ref 0.0–0.7)
HEMATOCRIT: 35.6 % — AB (ref 39.0–52.0)
Hemoglobin: 11.4 g/dL — ABNORMAL LOW (ref 13.0–17.0)
LYMPHS ABS: 1.7 10*3/uL (ref 0.7–4.0)
LYMPHS PCT: 16 % (ref 12–46)
MCH: 26.1 pg (ref 26.0–34.0)
MCHC: 32 g/dL (ref 30.0–36.0)
MCV: 81.5 fL (ref 78.0–100.0)
MONOS PCT: 8 % (ref 3–12)
Monocytes Absolute: 0.9 10*3/uL (ref 0.1–1.0)
NEUTROS ABS: 7.5 10*3/uL (ref 1.7–7.7)
NEUTROS PCT: 73 % (ref 43–77)
Platelets: 271 10*3/uL (ref 150–400)
RBC: 4.37 MIL/uL (ref 4.22–5.81)
RDW: 13.5 % (ref 11.5–15.5)
WBC: 10.3 10*3/uL (ref 4.0–10.5)

## 2014-11-13 LAB — MAGNESIUM: Magnesium: 2.1 mg/dL (ref 1.7–2.4)

## 2014-11-13 LAB — I-STAT CG4 LACTIC ACID, ED: LACTIC ACID, VENOUS: 0.95 mmol/L (ref 0.5–2.0)

## 2014-11-13 LAB — URIC ACID: Uric Acid, Serum: 8.7 mg/dL — ABNORMAL HIGH (ref 4.4–7.6)

## 2014-11-13 MED ORDER — ENOXAPARIN SODIUM 40 MG/0.4ML ~~LOC~~ SOLN
40.0000 mg | SUBCUTANEOUS | Status: DC
  Administered 2014-11-13: 40 mg via SUBCUTANEOUS
  Filled 2014-11-13: qty 0.4

## 2014-11-13 MED ORDER — CEFAZOLIN SODIUM 1-5 GM-% IV SOLN
1.0000 g | Freq: Three times a day (TID) | INTRAVENOUS | Status: DC
  Administered 2014-11-13 – 2014-11-14 (×2): 1 g via INTRAVENOUS
  Filled 2014-11-13 (×6): qty 50

## 2014-11-13 MED ORDER — FINASTERIDE 5 MG PO TABS
5.0000 mg | ORAL_TABLET | Freq: Every day | ORAL | Status: DC
  Administered 2014-11-13 – 2014-11-14 (×2): 5 mg via ORAL
  Filled 2014-11-13 (×3): qty 1

## 2014-11-13 MED ORDER — ONDANSETRON HCL 4 MG/2ML IJ SOLN: 4.0000 mg | Freq: Four times a day (QID) | INTRAMUSCULAR | Status: DC | PRN

## 2014-11-13 MED ORDER — ACETAMINOPHEN 650 MG RE SUPP: 650.0000 mg | Freq: Four times a day (QID) | RECTAL | Status: DC | PRN

## 2014-11-13 MED ORDER — ACETAMINOPHEN 325 MG PO TABS: 650.0000 mg | ORAL_TABLET | Freq: Four times a day (QID) | ORAL | Status: DC | PRN

## 2014-11-13 MED ORDER — IPRATROPIUM BROMIDE 0.02 % IN SOLN: 0.5000 mg | RESPIRATORY_TRACT | Status: DC | PRN

## 2014-11-13 MED ORDER — CLINDAMYCIN PHOSPHATE 600 MG/50ML IV SOLN
600.0000 mg | Freq: Once | INTRAVENOUS | Status: AC
  Administered 2014-11-13: 600 mg via INTRAVENOUS
  Filled 2014-11-13: qty 50

## 2014-11-13 MED ORDER — SORBITOL 70 % SOLN: 30.0000 mL | Freq: Every day | Status: DC | PRN

## 2014-11-13 MED ORDER — METHYLPREDNISOLONE SODIUM SUCC 125 MG IJ SOLR
60.0000 mg | Freq: Once | INTRAMUSCULAR | Status: AC
  Administered 2014-11-13: 60 mg via INTRAVENOUS
  Filled 2014-11-13: qty 2

## 2014-11-13 MED ORDER — ONDANSETRON HCL 4 MG PO TABS: 4.0000 mg | ORAL_TABLET | Freq: Four times a day (QID) | ORAL | Status: DC | PRN

## 2014-11-13 MED ORDER — ALBUTEROL SULFATE (2.5 MG/3ML) 0.083% IN NEBU: 2.5000 mg | INHALATION_SOLUTION | RESPIRATORY_TRACT | Status: DC | PRN

## 2014-11-13 MED ORDER — SENNOSIDES-DOCUSATE SODIUM 8.6-50 MG PO TABS: 1.0000 | ORAL_TABLET | Freq: Every evening | ORAL | Status: DC | PRN

## 2014-11-13 MED ORDER — ALUM & MAG HYDROXIDE-SIMETH 200-200-20 MG/5ML PO SUSP: 30.0000 mL | Freq: Four times a day (QID) | ORAL | Status: DC | PRN

## 2014-11-13 MED ORDER — AMLODIPINE BESYLATE 5 MG PO TABS
5.0000 mg | ORAL_TABLET | Freq: Every day | ORAL | Status: DC
  Administered 2014-11-13 – 2014-11-14 (×2): 5 mg via ORAL
  Filled 2014-11-13 (×2): qty 1

## 2014-11-13 MED ORDER — POTASSIUM CHLORIDE CRYS ER 20 MEQ PO TBCR
40.0000 meq | EXTENDED_RELEASE_TABLET | Freq: Once | ORAL | Status: AC
  Administered 2014-11-13: 40 meq via ORAL
  Filled 2014-11-13: qty 2

## 2014-11-13 MED ORDER — OXYCODONE HCL 5 MG PO TABS
5.0000 mg | ORAL_TABLET | ORAL | Status: DC | PRN
  Administered 2014-11-13: 5 mg via ORAL
  Filled 2014-11-13: qty 1

## 2014-11-13 MED ORDER — DOCUSATE SODIUM 100 MG PO CAPS
100.0000 mg | ORAL_CAPSULE | Freq: Two times a day (BID) | ORAL | Status: DC
  Administered 2014-11-13 – 2014-11-14 (×2): 100 mg via ORAL
  Filled 2014-11-13 (×2): qty 1

## 2014-11-13 MED ORDER — DONEPEZIL HCL 10 MG PO TABS
10.0000 mg | ORAL_TABLET | Freq: Every day | ORAL | Status: DC
  Administered 2014-11-13: 10 mg via ORAL
  Filled 2014-11-13: qty 1

## 2014-11-13 MED ORDER — HYDROCODONE-ACETAMINOPHEN 5-325 MG PO TABS: 1.0000 | ORAL_TABLET | ORAL | Status: DC | PRN

## 2014-11-13 MED ORDER — ISOSORBIDE MONONITRATE ER 60 MG PO TB24
60.0000 mg | ORAL_TABLET | Freq: Every day | ORAL | Status: DC
  Administered 2014-11-13 – 2014-11-14 (×2): 60 mg via ORAL
  Filled 2014-11-13 (×2): qty 1

## 2014-11-13 MED ORDER — HYDROCODONE-ACETAMINOPHEN 5-325 MG PO TABS
1.0000 | ORAL_TABLET | Freq: Once | ORAL | Status: AC
  Administered 2014-11-13: 1 via ORAL
  Filled 2014-11-13: qty 1

## 2014-11-13 MED ORDER — ENSURE ENLIVE PO LIQD
237.0000 mL | Freq: Two times a day (BID) | ORAL | Status: DC
  Administered 2014-11-14 (×2): 237 mL via ORAL

## 2014-11-13 MED ORDER — SODIUM CHLORIDE 0.9 % IV SOLN
INTRAVENOUS | Status: DC
  Administered 2014-11-13: 20:00:00 via INTRAVENOUS

## 2014-11-13 MED ORDER — METOPROLOL TARTRATE 25 MG PO TABS
25.0000 mg | ORAL_TABLET | Freq: Two times a day (BID) | ORAL | Status: DC
  Administered 2014-11-13 – 2014-11-14 (×2): 25 mg via ORAL
  Filled 2014-11-13 (×2): qty 1

## 2014-11-13 MED ORDER — ONDANSETRON HCL 4 MG/2ML IJ SOLN: 4.0000 mg | Freq: Three times a day (TID) | INTRAMUSCULAR | Status: DC | PRN

## 2014-11-13 MED ORDER — FENTANYL CITRATE (PF) 100 MCG/2ML IJ SOLN
25.0000 ug | Freq: Once | INTRAMUSCULAR | Status: AC
  Administered 2014-11-13: 25 ug via INTRAVENOUS
  Filled 2014-11-13: qty 2

## 2014-11-13 MED ORDER — DOXAZOSIN MESYLATE 4 MG PO TABS
4.0000 mg | ORAL_TABLET | Freq: Every day | ORAL | Status: DC
  Administered 2014-11-13 – 2014-11-14 (×2): 4 mg via ORAL
  Filled 2014-11-13 (×3): qty 1

## 2014-11-13 MED ORDER — PREDNISONE 20 MG PO TABS
40.0000 mg | ORAL_TABLET | Freq: Every day | ORAL | Status: DC
  Administered 2014-11-14: 40 mg via ORAL
  Filled 2014-11-13 (×2): qty 2

## 2014-11-13 NOTE — Progress Notes (Signed)
ANTIBIOTIC CONSULT NOTE - INITIAL  Pharmacy Consult for cefazolin Indication: cellulitis  No Known Allergies  Patient Measurements: Weight: 198 lb (89.812 kg)  Vital Signs: Temp: 98.6 F (37 C) (07/02 1557) Temp Source: Oral (07/02 1557) BP: 145/86 mmHg (07/02 1557) Pulse Rate: 68 (07/02 1557) Intake/Output from previous day:   Intake/Output from this shift:    Labs:  Recent Labs  11/13/14 1130  WBC 10.3  HGB 11.4*  PLT 271  CREATININE 1.62*   Estimated Creatinine Clearance: 37.6 mL/min (by C-G formula based on Cr of 1.62). No results for input(s): VANCOTROUGH, VANCOPEAK, VANCORANDOM, GENTTROUGH, GENTPEAK, GENTRANDOM, TOBRATROUGH, TOBRAPEAK, TOBRARND, AMIKACINPEAK, AMIKACINTROU, AMIKACIN in the last 72 hours.   Microbiology: No results found for this or any previous visit (from the past 720 hour(s)).  Medical History: Past Medical History  Diagnosis Date  . Hypertension   . Hx of cardiac cath   . Cancer     prostate  . Alzheimer's dementia   . Coronary artery disease   . HTN (hypertension) 11/13/2014  . Gout     Medications:  Prescriptions prior to admission  Medication Sig Dispense Refill Last Dose  . amLODipine (NORVASC) 5 MG tablet Take 5 mg by mouth daily.   Past Week at Unknown time  . azithromycin (ZITHROMAX) 250 MG tablet Take 1 tablet (250 mg total) by mouth daily. 4 tablet 0 Past Week at Unknown time  . clopidogrel (PLAVIX) 75 MG tablet Take 75 mg by mouth daily.   11/12/2014 at Unknown time  . donepezil (ARICEPT) 10 MG tablet Take 10 mg by mouth at bedtime.   11/12/2014 at Unknown time  . doxazosin (CARDURA) 8 MG tablet Take 4 mg by mouth daily.   Past Week at Unknown time  . finasteride (PROSCAR) 5 MG tablet Take 5 mg by mouth daily.   Past Week at Unknown time  . isosorbide mononitrate (IMDUR) 60 MG 24 hr tablet Take 60 mg by mouth daily.   11/12/2014 at Unknown time  . metoprolol tartrate (LOPRESSOR) 25 MG tablet Take 25 mg by mouth 2 (two) times  daily.   11/12/2014 at Unknown time  . Multiple Vitamin (MULTIVITAMIN WITH MINERALS) TABS tablet Take 1 tablet by mouth daily.   Past Week at Unknown time   Assessment: 81 year old man to start on cefazolin for cellulitis.  Serum creatinine is 1.6 and CrCl is 24mL/min, however dosage adjust is not required at this time.  Goal of Therapy:  treat infection  Plan:  Follow up culture results cefazolin 1g IV q8h and monitor renal function  Candie Mile 11/13/2014,5:23 PM

## 2014-11-13 NOTE — H&P (Addendum)
[facility] History and Physical  Patrick [name] ZOX:[id number] DOB: 1930/09/03 DOA: 11/13/2014  Referring physician: Dr. Rogene Houston PCP: Philis Fendt, MD   Chief Complaint: Right knee pain  HPI: Patrick [name] is a 81 y.o. male  With prior history of gout, hypertension, prostate cancer, BPH, Alzheimer's dementia, coronary artery disease who presents to the ED with a four-day history of significant right anterior knee pain with burning, erythema, warmth to patient's knee pain has been worsening on a daily basis per wife to the point where he is unable to ambulate. Patient's wife called PCP and it was recommended patient present to the emergency room. Patient does have a history of Alzheimer's dementia and a such history was obtained from patient's wife. Patient's wife denies any recent fevers, no chills, no nausea, no vomiting, no chest pain, no shortness of breath, no cough, no abdominal pain, no diarrhea, no constipation, no dysuria, no melena, no hematochezia, no hematemesis. Patient's wife does endorse decreased oral intake. Patient was seen in the emergency room comprehensive metabolic profile obtained at a potassium of 3.2 a creatinine of 1.62 abdomen of 2.7 otherwise was within normal limits. Lactic acid level was 0.95. CBC obtained had a white count of 10.3 hemoglobin of 11.4 otherwise was within normal limits. Plain films of the right knee showed prominent soft tissue edema at the anterior aspect of the knee. The possibility of prepatellar bursitis or cellulitis of be considered. Moderate joint effusion. Tricompartmental osteoarthritis. Patient was given a dose of clindamycin in the ED due to concerns for a septic bursitis. Knee immobilizer was placed however patient was unable to ambulate and as such [facility] were called to admit the patient for further evaluation and management.   Review of Systems:  As per history of present illness otherwise negative. Constitutional:    No weight loss, night sweats, Fevers, chills, fatigue.  HEENT:  No headaches, Difficulty swallowing,Tooth/dental problems,Sore throat,  No sneezing, itching, ear ache, nasal congestion, post nasal drip,  Cardio-vascular:  No chest pain, Orthopnea, PND, swelling in lower extremities, anasarca, dizziness, palpitations  GI:  No heartburn, indigestion, abdominal pain, nausea, vomiting, diarrhea, change in bowel habits, loss of appetite  Resp:  No shortness of breath with exertion or at rest. No excess mucus, no productive cough, No non-productive cough, No coughing up of blood.No change in color of mucus.No wheezing.No chest wall deformity  Skin:  no rash or lesions.  GU:  no dysuria, change in color of urine, no urgency or frequency. No flank pain.  Musculoskeletal:  No joint pain or swelling. No decreased range of motion. No back pain.  Psych:  No change in mood or affect. No depression or anxiety. No memory loss.   Past Medical History  Diagnosis Date  . Hypertension   . Hx of cardiac cath   . Cancer     prostate  . Alzheimer's dementia   . Coronary artery disease   . HTN (hypertension) 11/13/2014  . Gout    Past Surgical History  Procedure Laterality Date  . Knee surgery    . Coronary angioplasty with stent placement     Social History:  reports that he has quit smoking. He does not have any smokeless tobacco history on file. He reports that he does not drink alcohol or use illicit drugs.  No Known Allergies  Family History  Problem Relation Age of Onset  . Alzheimer's disease Mother   . Pneumonia Father      Prior to Admission  medications   Medication Sig Start Date End Date Taking? Authorizing Provider  amLODipine (NORVASC) 5 MG tablet Take 5 mg by mouth daily.   Yes Historical Provider, MD  azithromycin (ZITHROMAX) 250 MG tablet Take 1 tablet (250 mg total) by mouth daily. 11/01/14  Yes Debby Freiberg, MD  clopidogrel (PLAVIX) 75 MG tablet Take 75 mg by mouth daily.    Yes Historical Provider, MD  donepezil (ARICEPT) 10 MG tablet Take 10 mg by mouth at bedtime.   Yes Historical Provider, MD  doxazosin (CARDURA) 8 MG tablet Take 4 mg by mouth daily.   Yes Historical Provider, MD  finasteride (PROSCAR) 5 MG tablet Take 5 mg by mouth daily.   Yes Historical Provider, MD  isosorbide mononitrate (IMDUR) 60 MG 24 hr tablet Take 60 mg by mouth daily.   Yes Historical Provider, MD  metoprolol tartrate (LOPRESSOR) 25 MG tablet Take 25 mg by mouth 2 (two) times daily.   Yes Historical Provider, MD  Multiple Vitamin (MULTIVITAMIN WITH MINERALS) TABS tablet Take 1 tablet by mouth daily.   Yes Historical Provider, MD   Physical Exam: Filed Vitals:   11/13/14 1100 11/13/14 1115 11/13/14 1130 11/13/14 1557  BP: 153/90 145/90 142/88 145/86  Pulse: 62 62 66 68  Temp:    98.6 F (37 C)  TempSrc:    Oral  Resp: 19 15 17 18   Weight:      SpO2: 96% 95% 97% 98%    Wt Readings from Last 3 Encounters:  11/13/14 89.812 kg (198 lb)  06/14/11 81.647 kg (180 lb)    General:  Well-developed well-nourished in no acute cardiopulmonary distress. Patient confused which per wife is baseline.  Eyes: PERRLA, EOMI, normal lids, irises & conjunctiva ENT: grossly normal hearing, lips & tongue Neck: no LAD, masses or thyromegaly Cardiovascular: RRR, no m/r/g. No LE edema. Telemetry: SR, no arrhythmias  Respiratory: CTA bilaterally, no w/r/r. Normal respiratory effort. Abdomen: soft, ntnd, positive bowel sounds, no rebound, no guarding Skin: no rash or induration seen on limited exam Musculoskeletal: 5 out of 5 bilateral upper extremity strength. 5 out of 5 left lower extremity strength. Right lower extremity in a knee mobilizer. Right knee with erythema, exquisite tenderness to palpation, warmth. Psychiatric: grossly normal mood and affect, speech fluent and appropriate Neurologic: Alert. Cranial nerves II through XII grossly intact. Sensation is intact. Visual fields are intact.  No focal deficits.          Labs on Admission:  Basic Metabolic Panel:  Recent Labs Lab 11/13/14 1130  NA 141  K 3.2*  CL 102  CO2 28  GLUCOSE 95  BUN 11  CREATININE 1.62*  CALCIUM 8.4*   Liver Function Tests:  Recent Labs Lab 11/13/14 1130  AST 17  ALT 17  ALKPHOS 60  BILITOT 0.7  PROT 6.7  ALBUMIN 2.7*   No results for input(s): LIPASE, AMYLASE in the last 168 hours. No results for input(s): AMMONIA in the last 168 hours. CBC:  Recent Labs Lab 11/13/14 1130  WBC 10.3  NEUTROABS 7.5  HGB 11.4*  HCT 35.6*  MCV 81.5  PLT 271   Cardiac Enzymes: No results for input(s): CKTOTAL, CKMB, CKMBINDEX, TROPONINI in the last 168 hours.  BNP (last 3 results) No results for input(s): BNP in the last 8760 hours.  ProBNP (last 3 results) No results for input(s): PROBNP in the last 8760 hours.  CBG: No results for input(s): GLUCAP in the last 168 hours.  Radiological Exams on Admission:  Dg Knee Complete 4 Views Right  11/13/2014   CLINICAL DATA:  Right patellar knee pain for 2 weeks with redness and swelling. History of gout.  EXAM: RIGHT KNEE - COMPLETE 4+ VIEW  COMPARISON:  05/09/2014  FINDINGS: There is prominent soft tissue swelling anterior to the patella and patellar tendon and distal quadriceps patent tendon. There is a moderate joint effusion.  No fracture or dislocation. Moderately severe tricompartmental osteoarthritis, most severe in the medial compartment. Chondrocalcinosis. Extensive arterial vascular calcification around the knee.  IMPRESSION: Prominent soft tissue edema at the anterior aspect of the knee. The possibility of prepatellar bursitis or cellulitis should be considered.  Moderate joint effusion.  Tricompartmental osteoarthritis.   Electronically Signed   By: Lorriane Shire M.D.   On: 11/13/2014 12:44    EKG: None  Assessment/Plan Principal Problem:   Right knee pain Active Problems:   Patellar bursitis of right knee   Hypokalemia   Gout of  right knee: Probable   CKD (chronic kidney disease) stage 3, GFR 30-59 ml/min   HTN (hypertension)   BPH (benign prostatic hyperplasia)   Alzheimer's dementia   CAD (coronary artery disease) of artery bypass graft   Knee pain, right   Effusion of knee joint right  #1 right knee pain likely probable acute gout flare of the right knee vs cellulitis vs prepatellar bursitis vs septic bursitis Patient with exquisite right knee pain on the patella with erythema and some warmth. Patient with prior history of gout. Likely an acute gouty flare versus patellar bursitis of the right knee. Patient was given a dose of IV clindamycin in the emergency room. Patient is afebrile. WBC is about 10.3. Likely an acute gouty flare. Check a uric acid level. Will place on a quick steroids taper. Will also place on IV Ancef. Orthopedics have been consulted for further evaluation and management. Follow.  #2 moderate right knee joint effusion Per x-ray of the knee. Orthopedic consultation pending.  #3 hypokalemia Check a magnesium level. Replete.  #4 chronic kidney disease stage III Patient does have a elevated creatinine that seems to be chronic in nature. Will monitor for now. Follow.  #5 hypertension Stable. Continue home regimen of Lopressor, Proscar, Cardura, Norvasc,imdur.  #6 BPH Continue home regimen of Proscar and Cardura.  #7 coronary artery disease Stable. Continue home regimen of Lopressor, Imdur.  #8 prophylaxis Lovenox for DVT prophylaxis.    Code Status: DO NOT RESUSCITATE DVT Prophylaxis: Lovenox Family Communication: Updated patient and wife at bedside. Disposition Plan: Admit to medsurg  Time spent: [place] MD [facility] Pager [phone]

## 2014-11-13 NOTE — Consult Note (Signed)
Reason for Consult: Right knee pain Referring Physician:  Irine Seal, MD  Patrick [name] is an 81 y.o. male.  HPI: 81 yo male with dementia presented to the ER because his wife recognized he was having difficulty with ambulating on his right leg.  No injuries, falls Wife reports history of gout  Past Medical History  Diagnosis Date  . Hypertension   . Hx of cardiac cath   . Cancer     prostate  . Alzheimer's dementia   . Coronary artery disease   . HTN (hypertension) 11/13/2014  . Gout     Past Surgical History  Procedure Laterality Date  . Knee surgery    . Coronary angioplasty with stent placement      Family History  Problem Relation Age of Onset  . Alzheimer's disease Mother   . Pneumonia Father     Social History:  reports that he has quit smoking. He does not have any smokeless tobacco history on file. He reports that he does not drink alcohol or use illicit drugs.  Allergies: No Known Allergies  Medications:  I have reviewed the patient's current medications. Continuous: . sodium chloride      Results for orders placed or performed during the hospital encounter of 11/13/14 (from the past 24 hour(s))  CBC with Differential     Status: Abnormal   Collection Time: 11/13/14 11:30 AM  Result Value Ref Range   WBC 10.3 4.0 - 10.5 K/uL   RBC 4.37 4.22 - 5.81 MIL/uL   Hemoglobin 11.4 (L) 13.0 - 17.0 g/dL   HCT 35.6 (L) 39.0 - 52.0 %   MCV 81.5 78.0 - 100.0 fL   MCH 26.1 26.0 - 34.0 pg   MCHC 32.0 30.0 - 36.0 g/dL   RDW 13.5 11.5 - 15.5 %   Platelets 271 150 - 400 K/uL   Neutrophils Relative % 73 43 - 77 %   Neutro Abs 7.5 1.7 - 7.7 K/uL   Lymphocytes Relative 16 12 - 46 %   Lymphs Abs 1.7 0.7 - 4.0 K/uL   Monocytes Relative 8 3 - 12 %   Monocytes Absolute 0.9 0.1 - 1.0 K/uL   Eosinophils Relative 3 0 - 5 %   Eosinophils Absolute 0.3 0.0 - 0.7 K/uL   Basophils Relative 0 0 - 1 %   Basophils Absolute 0.0 0.0 - 0.1 K/uL  Comprehensive metabolic panel      Status: Abnormal   Collection Time: 11/13/14 11:30 AM  Result Value Ref Range   Sodium 141 135 - 145 mmol/L   Potassium 3.2 (L) 3.5 - 5.1 mmol/L   Chloride 102 101 - 111 mmol/L   CO2 28 22 - 32 mmol/L   Glucose, Bld 95 65 - 99 mg/dL   BUN 11 6 - 20 mg/dL   Creatinine, Ser 1.62 (H) 0.61 - 1.24 mg/dL   Calcium 8.4 (L) 8.9 - 10.3 mg/dL   Total Protein 6.7 6.5 - 8.1 g/dL   Albumin 2.7 (L) 3.5 - 5.0 g/dL   AST 17 15 - 41 U/L   ALT 17 17 - 63 U/L   Alkaline Phosphatase 60 38 - 126 U/L   Total Bilirubin 0.7 0.3 - 1.2 mg/dL   GFR calc non Af Amer 38 (L) >60 mL/min   GFR calc Af Amer 44 (L) >60 mL/min   Anion gap 11 5 - 15  I-Stat CG4 Lactic Acid, ED     Status: None   Collection Time: 11/13/14  11:42 AM  Result Value Ref Range   Lactic Acid, Venous 0.95 0.5 - 2.0 mmol/L  Uric acid     Status: Abnormal   Collection Time: 11/13/14  4:15 PM  Result Value Ref Range   Uric Acid, Serum 8.7 (H) 4.4 - 7.6 mg/dL  Magnesium     Status: None   Collection Time: 11/13/14  4:15 PM  Result Value Ref Range   Magnesium 2.1 1.7 - 2.4 mg/dL     X-ray: CLINICAL DATA: Right patellar knee pain for 2 weeks with redness and swelling. History of gout.  EXAM: RIGHT KNEE - COMPLETE 4+ VIEW  COMPARISON: 05/09/2014  FINDINGS: There is prominent soft tissue swelling anterior to the patella and patellar tendon and distal quadriceps patent tendon. There is a moderate joint effusion.  No fracture or dislocation. Moderately severe tricompartmental osteoarthritis, most severe in the medial compartment. Chondrocalcinosis. Extensive arterial vascular calcification around the knee.  IMPRESSION: Prominent soft tissue edema at the anterior aspect of the knee. The possibility of prepatellar bursitis or cellulitis should be considered.  Moderate joint effusion. Tricompartmental osteoarthritis.   Electronically Signed  By: Lorriane Shire M.D.  On: 11/13/2014 12:44  ROS  History of  dementia History reviewed from his wife gleaned from admitting H&P  Blood pressure 145/86, pulse 68, temperature 98.6 F (37 C), temperature source Oral, resp. rate 18, weight 89.812 kg (198 lb), SpO2 98 %.  Physical Exam  Awake alert Just finished his dinner Right knee in immobilizer Out of immobilizer his knee had some minor erythema No effusion Some minor prepatellar swelling Tolerates passive ROM without significant pain NVI distally   Assessment/Plan: Right knee pain with uncertain etiology Honestly difficult to discern as I feel his response on exam does not match findings Differential would include cellulitis verus pre-patellar bursitis (septic versus aseptic) versus gout versus arthritic flare  Significant knee OA, not a candidate for knee replacement due to dementia  Recommend IV antibiotics in addition to prednisone to quite pain and treat cellulitis RTC in 2 weeks Discussed thoughts and plan with Dr Grier Mitts D 11/13/2014, 5:34 PM

## 2014-11-13 NOTE — Progress Notes (Signed)
Patient from ED, arrived on unit at 1512 via stretcher, accompanied by ED NT and wife. Patient in stable at time of arrival; remains in stable condition. Patient alert and oriented x3. Disoriented to time and situation. Plan of care reviewed with patient's wife. Verbalized understanding. Patient and wife oriented to unit. Patient stated he'll watch safety video at another time. Vitals stable.Marland KitchenMarland KitchenMarland Kitchen

## 2014-11-13 NOTE — ED Notes (Signed)
Pt with knee pain since Tues and loss of appetite x 1 week.  Dr Jeanie Cooks stated to bring pt to ED for further work-up.

## 2014-11-13 NOTE — ED Notes (Signed)
Attempted to ambulate pt with knee immobilizer and cane. Pt with a lot of pain and unsteady on feet. Helped back into bed. Kirchenko, PA-C aware.

## 2014-11-13 NOTE — ED Provider Notes (Signed)
CSN: [id number]     Arrival date & time 11/13/14  8366 History   First MD Initiated Contact with Patient 11/13/14 1041     Chief Complaint  Patient presents with  . Knee Pain  . loss of appetite      (Consider location/radiation/quality/duration/timing/severity/associated sxs/prior Treatment) HPI Patrick [name] is a 81 y.o. male with history of hypertension, Alzheimer's dementia, coronary artery disease, presents to emergency department complaining of right knee pain for about 3 days and loss of appetite for multiple weeks. Patient's wife's providing most of the history. She states that she hasn't been able to get up to eat. At times she does not give him his medications because she does not want him to get sick with nothing on his stomach and have to clean up. Patient states "I just on 46." She has been encouraged him to drink ensure which he does at times. Patient has also had increased redness, swelling, pain to the right knee. He is having difficulty walking and bending his knee. No injuries. No fever or chills. No other complaints. No treatment prior to coming in.  Past Medical History  Diagnosis Date  . Hypertension   . Hx of cardiac cath   . Cancer     prostate  . Alzheimer's dementia   . Coronary artery disease    Past Surgical History  Procedure Laterality Date  . Knee surgery    . Coronary angioplasty with stent placement     No family history on file. History  Substance Use Topics  . Smoking status: Former Research scientist (life sciences)  . Smokeless tobacco: Not on file  . Alcohol Use: No    Review of Systems  Constitutional: Positive for chills, appetite change and fatigue. Negative for fever.  Musculoskeletal: Positive for joint swelling and arthralgias.  Neurological: Positive for weakness.  All other systems reviewed and are negative.     Allergies  Review of patient's allergies indicates no known allergies.  Home Medications   Prior to Admission medications   Medication Sig  Start Date End Date Taking? Authorizing Provider  amLODipine (NORVASC) 5 MG tablet Take 5 mg by mouth daily.   Yes Historical Provider, MD  azithromycin (ZITHROMAX) 250 MG tablet Take 1 tablet (250 mg total) by mouth daily. 11/01/14  Yes Debby Freiberg, MD  clopidogrel (PLAVIX) 75 MG tablet Take 75 mg by mouth daily.   Yes Historical Provider, MD  donepezil (ARICEPT) 10 MG tablet Take 10 mg by mouth at bedtime.   Yes Historical Provider, MD  doxazosin (CARDURA) 8 MG tablet Take 4 mg by mouth daily.   Yes Historical Provider, MD  finasteride (PROSCAR) 5 MG tablet Take 5 mg by mouth daily.   Yes Historical Provider, MD  isosorbide mononitrate (IMDUR) 60 MG 24 hr tablet Take 60 mg by mouth daily.   Yes Historical Provider, MD  metoprolol tartrate (LOPRESSOR) 25 MG tablet Take 25 mg by mouth 2 (two) times daily.   Yes Historical Provider, MD  Multiple Vitamin (MULTIVITAMIN WITH MINERALS) TABS tablet Take 1 tablet by mouth daily.   Yes Historical Provider, MD   BP 142/88 mmHg  Pulse 66  Temp(Src) 98.4 F (36.9 C) (Oral)  Resp 17  Wt 198 lb (89.812 kg)  SpO2 97% Physical Exam  Constitutional: He appears well-developed and well-nourished. No distress.  HENT:  Head: Normocephalic and atraumatic.  Eyes: Conjunctivae are normal.  Neck: Neck supple.  Cardiovascular: Normal rate, regular rhythm and normal heart sounds.   Pulmonary/Chest: Effort  normal. No respiratory distress. He has no wheezes. He has no rales.  Abdominal: Soft. Bowel sounds are normal. He exhibits no distension. There is no tenderness. There is no rebound.  Musculoskeletal: He exhibits no edema.  Right knee with erythema, swelling, tenderness to palpation over anterior joints, mainly over her patella and surrounding soft tissue. No tenderness or erythema to posterior joint. Patient able to flex his knee to 90 and fully extended. Pedal pulses are intact and equal bilaterally.  Neurological: He is alert.  Skin: Skin is warm and  dry.  Nursing note and vitals reviewed.   ED Course  Procedures (including critical care time) Labs Review Labs Reviewed  CBC WITH DIFFERENTIAL/PLATELET - Abnormal; Notable for the following:    Hemoglobin 11.4 (*)    HCT 35.6 (*)    All other components within normal limits  COMPREHENSIVE METABOLIC PANEL - Abnormal; Notable for the following:    Potassium 3.2 (*)    Creatinine, Ser 1.62 (*)    Calcium 8.4 (*)    Albumin 2.7 (*)    GFR calc non Af Amer 38 (*)    GFR calc Af Amer 44 (*)    All other components within normal limits  I-STAT CG4 LACTIC ACID, ED    Imaging Review Dg Knee Complete 4 Views Right  11/13/2014   CLINICAL DATA:  Right patellar knee pain for 2 weeks with redness and swelling. History of gout.  EXAM: RIGHT KNEE - COMPLETE 4+ VIEW  COMPARISON:  05/09/2014  FINDINGS: There is prominent soft tissue swelling anterior to the patella and patellar tendon and distal quadriceps patent tendon. There is a moderate joint effusion.  No fracture or dislocation. Moderately severe tricompartmental osteoarthritis, most severe in the medial compartment. Chondrocalcinosis. Extensive arterial vascular calcification around the knee.  IMPRESSION: Prominent soft tissue edema at the anterior aspect of the knee. The possibility of prepatellar bursitis or cellulitis should be considered.  Moderate joint effusion.  Tricompartmental osteoarthritis.   Electronically Signed   By: Lorriane Shire M.D.   On: 11/13/2014 12:44     EKG Interpretation None      MDM   Final diagnoses:  Septic prepatellar bursitis of right knee  Weakness  Knee pain, right  Anorexia     patient with right knee pain onset 3-4 days ago. Knee is erythematous, warm to the touch, diffusely tender over anterior knee. Question cellulitis, septic bursitis. Patient is afebrile, nontoxic appearing otherwise. Vital signs are normal. Labs unremarkable. Discussed with Dr. Bobby Rumpf, who has seen patient as well, agrees that  this is most likely not septic joint. No history of gout. Pain treated with fentanyl and Norco. Clindamycin ordered. Knee immobilizer applied. We'll try to ambulate.  2:27 PM Patient unable to ambulate, he is able to stand up but not walk. Will admit for further inpatient treatment. Vital signs continued to be normal. He continues to be nontoxic. Wife is also expressing concern about his anorexia, states that he is not eating much at home. Will order tray of food here. Pt denies difficulty swallowing, states just has no appetite.   2:39 PM Spoke with Dr. Alvan Dame, will consult on patient.   Filed Vitals:   11/13/14 1017 11/13/14 1100 11/13/14 1115 11/13/14 1130  BP: 138/94 153/90 145/90 142/88  Pulse: 84 62 62 66  Temp: 98.4 F (36.9 C)     TempSrc: Oral     Resp: 20 19 15 17   Weight: 198 lb (89.812 kg)  SpO2: 95% 96% 95% 97%     Jeannett Senior, PA-C 11/13/14 1545

## 2014-11-13 NOTE — ED Provider Notes (Signed)
Medical screening examination/treatment/procedure(s) were conducted as a shared visit with non-physician practitioner(s) and myself.  I personally evaluated the patient during the encounter.   EKG Interpretation None       Results for orders placed or performed during the hospital encounter of 11/13/14  CBC with Differential  Result Value Ref Range   WBC 10.3 4.0 - 10.5 K/uL   RBC 4.37 4.22 - 5.81 MIL/uL   Hemoglobin 11.4 (L) 13.0 - 17.0 g/dL   HCT 35.6 (L) 39.0 - 52.0 %   MCV 81.5 78.0 - 100.0 fL   MCH 26.1 26.0 - 34.0 pg   MCHC 32.0 30.0 - 36.0 g/dL   RDW 13.5 11.5 - 15.5 %   Platelets 271 150 - 400 K/uL   Neutrophils Relative % 73 43 - 77 %   Neutro Abs 7.5 1.7 - 7.7 K/uL   Lymphocytes Relative 16 12 - 46 %   Lymphs Abs 1.7 0.7 - 4.0 K/uL   Monocytes Relative 8 3 - 12 %   Monocytes Absolute 0.9 0.1 - 1.0 K/uL   Eosinophils Relative 3 0 - 5 %   Eosinophils Absolute 0.3 0.0 - 0.7 K/uL   Basophils Relative 0 0 - 1 %   Basophils Absolute 0.0 0.0 - 0.1 K/uL  Comprehensive metabolic panel  Result Value Ref Range   Sodium 141 135 - 145 mmol/L   Potassium 3.2 (L) 3.5 - 5.1 mmol/L   Chloride 102 101 - 111 mmol/L   CO2 28 22 - 32 mmol/L   Glucose, Bld 95 65 - 99 mg/dL   BUN 11 6 - 20 mg/dL   Creatinine, Ser 1.62 (H) 0.61 - 1.24 mg/dL   Calcium 8.4 (L) 8.9 - 10.3 mg/dL   Total Protein 6.7 6.5 - 8.1 g/dL   Albumin 2.7 (L) 3.5 - 5.0 g/dL   AST 17 15 - 41 U/L   ALT 17 17 - 63 U/L   Alkaline Phosphatase 60 38 - 126 U/L   Total Bilirubin 0.7 0.3 - 1.2 mg/dL   GFR calc non Af Amer 38 (L) >60 mL/min   GFR calc Af Amer 44 (L) >60 mL/min   Anion gap 11 5 - 15  I-Stat CG4 Lactic Acid, ED  Result Value Ref Range   Lactic Acid, Venous 0.95 0.5 - 2.0 mmol/L   Dg Chest 2 View  11/01/2014   CLINICAL DATA:  Acute onset of dry cough.  Initial encounter.  EXAM: CHEST  2 VIEW  COMPARISON:  Chest radiograph performed 05/09/2014  FINDINGS: The lungs are hypoexpanded. Mild bibasilar opacities  may reflect atelectasis or mild pneumonia. There is no evidence of pleural effusion or pneumothorax.  The heart is normal in size; the mediastinal contour is within normal limits. No acute osseous abnormalities are seen.  IMPRESSION: Lungs hypoexpanded. Mild bibasilar opacities may reflect atelectasis or mild pneumonia.   Electronically Signed   By: Garald Balding M.D.   On: 11/01/2014 01:07   Dg Knee Complete 4 Views Right  11/13/2014   CLINICAL DATA:  Right patellar knee pain for 2 weeks with redness and swelling. History of gout.  EXAM: RIGHT KNEE - COMPLETE 4+ VIEW  COMPARISON:  05/09/2014  FINDINGS: There is prominent soft tissue swelling anterior to the patella and patellar tendon and distal quadriceps patent tendon. There is a moderate joint effusion.  No fracture or dislocation. Moderately severe tricompartmental osteoarthritis, most severe in the medial compartment. Chondrocalcinosis. Extensive arterial vascular calcification around the knee.  IMPRESSION:  Prominent soft tissue edema at the anterior aspect of the knee. The possibility of prepatellar bursitis or cellulitis should be considered.  Moderate joint effusion.  Tricompartmental osteoarthritis.   Electronically Signed   By: Lorriane Shire M.D.   On: 11/13/2014 12:44    Patient with complaint of pain to the right knee about 3 days. Patient does have dementia Alzheimer's disease. Clinically the knee has erythema anteriorly some evidence of effusion reasonable flexion at the knee. An area of dark area measuring probably about 3 cm of anterior part of knee that is most tender. More consistent with a cellulitis bursitis. Agree with findings on the x-ray of the knee. There's no leukocytosis. Little bit of a low-grade fever 100.1. Patient will require antibodies will try a knee immobilizer if he is not able to ambulate well with that will require admission. Clinically doubt that this is a septic arthritis.  Fredia Sorrow, MD 11/13/14 1316

## 2014-11-14 ENCOUNTER — Encounter (HOSPITAL_COMMUNITY): Payer: Self-pay | Admitting: Internal Medicine

## 2014-11-14 DIAGNOSIS — M179 Osteoarthritis of knee, unspecified: Secondary | ICD-10-CM | POA: Diagnosis present

## 2014-11-14 DIAGNOSIS — M7041 Prepatellar bursitis, right knee: Principal | ICD-10-CM

## 2014-11-14 DIAGNOSIS — B9689 Other specified bacterial agents as the cause of diseases classified elsewhere: Secondary | ICD-10-CM

## 2014-11-14 DIAGNOSIS — M1710 Unilateral primary osteoarthritis, unspecified knee: Secondary | ICD-10-CM

## 2014-11-14 DIAGNOSIS — G309 Alzheimer's disease, unspecified: Secondary | ICD-10-CM

## 2014-11-14 DIAGNOSIS — F0280 Dementia in other diseases classified elsewhere without behavioral disturbance: Secondary | ICD-10-CM

## 2014-11-14 DIAGNOSIS — M71161 Other infective bursitis, right knee: Secondary | ICD-10-CM | POA: Insufficient documentation

## 2014-11-14 HISTORY — DX: Osteoarthritis of knee, unspecified: M17.9

## 2014-11-14 HISTORY — DX: Unilateral primary osteoarthritis, unspecified knee: M17.10

## 2014-11-14 LAB — CBC
HCT: 31.9 % — ABNORMAL LOW (ref 39.0–52.0)
Hemoglobin: 10.4 g/dL — ABNORMAL LOW (ref 13.0–17.0)
MCH: 26.1 pg (ref 26.0–34.0)
MCHC: 32.6 g/dL (ref 30.0–36.0)
MCV: 79.9 fL (ref 78.0–100.0)
PLATELETS: 284 10*3/uL (ref 150–400)
RBC: 3.99 MIL/uL — ABNORMAL LOW (ref 4.22–5.81)
RDW: 13.4 % (ref 11.5–15.5)
WBC: 7.9 10*3/uL (ref 4.0–10.5)

## 2014-11-14 LAB — BASIC METABOLIC PANEL
Anion gap: 9 (ref 5–15)
BUN: 16 mg/dL (ref 6–20)
CHLORIDE: 101 mmol/L (ref 101–111)
CO2: 28 mmol/L (ref 22–32)
Calcium: 8.3 mg/dL — ABNORMAL LOW (ref 8.9–10.3)
Creatinine, Ser: 1.56 mg/dL — ABNORMAL HIGH (ref 0.61–1.24)
GFR calc Af Amer: 46 mL/min — ABNORMAL LOW (ref >60)
GFR, EST NON AFRICAN AMERICAN: 40 mL/min — AB (ref >60)
Glucose, Bld: 133 mg/dL — ABNORMAL HIGH (ref 65–99)
Potassium: 3.9 mmol/L (ref 3.5–5.1)
Sodium: 138 mmol/L (ref 135–145)

## 2014-11-14 MED ORDER — HYDROCODONE-ACETAMINOPHEN 5-325 MG PO TABS: 1.0000 | ORAL_TABLET | ORAL | Status: AC | PRN

## 2014-11-14 MED ORDER — LORAZEPAM 2 MG/ML IJ SOLN: 0.5000 mg | Freq: Once | INTRAMUSCULAR | Status: DC

## 2014-11-14 MED ORDER — CEPHALEXIN 500 MG PO CAPS: 500.0000 mg | ORAL_CAPSULE | Freq: Two times a day (BID) | ORAL | Status: AC

## 2014-11-14 MED ORDER — PREDNISONE 20 MG PO TABS: 40.0000 mg | ORAL_TABLET | Freq: Every day | ORAL | Status: AC

## 2014-11-14 MED ORDER — ENSURE ENLIVE PO LIQD: 237.0000 mL | Freq: Two times a day (BID) | ORAL | Status: AC

## 2014-11-14 MED ORDER — DIPHENHYDRAMINE HCL 25 MG PO CAPS
25.0000 mg | ORAL_CAPSULE | Freq: Once | ORAL | Status: AC
  Administered 2014-11-14: 25 mg via ORAL
  Filled 2014-11-14: qty 1

## 2014-11-14 MED ORDER — ZOLPIDEM TARTRATE 5 MG PO TABS
5.0000 mg | ORAL_TABLET | Freq: Every evening | ORAL | Status: DC | PRN
  Administered 2014-11-14: 5 mg via ORAL
  Filled 2014-11-14: qty 1

## 2014-11-14 NOTE — Evaluation (Signed)
Physical Therapy Evaluation Patient Details Name: Patrick [name] MRN: [id number] DOB: 1930/09/14 Today's Date: 11/14/2014   History of Present Illness  Pt is a 81 y/o M admitted 2/2 R knee pain that was limiting him from being able to ambulate.  Pt's PMH includes gout, HTN, Alzheimer's dementia, CAD.  Clinical Impression  Pt admitted with above diagnosis. Pt currently with functional limitations due to the deficits listed below (see PT Problem List). Pt able to safely ambulate w/ RW and discussed w/ wife who was agreeable that pt will use rollator at home until gout flare up is over and pt back to baseline. Pt will benefit from skilled PT to increase their independence and safety with mobility to allow discharge to the venue listed below.      Follow Up Recommendations No PT follow up;Supervision/Assistance - 24 hour    Equipment Recommendations  None recommended by PT (pt has rollator at home)    Recommendations for Other Services       Precautions / Restrictions Precautions Precautions: Fall Required Braces or Orthoses: Knee Immobilizer - Right Knee Immobilizer - Right: Other (comment) (in room, no specific order) Restrictions Weight Bearing Restrictions: No      Mobility  Bed Mobility Overal bed mobility: Modified Independent             General bed mobility comments: Min use of bed rails.  VCs and TCs for sequencing and cues to scoot to EOB once sitting.  Increased time.  Transfers Overall transfer level: Needs assistance Equipment used: Rolling walker (2 wheeled) Transfers: Sit to/from Stand Sit to Stand: Min guard         General transfer comment: Min guard for safety and VCs for technique.  Ambulation/Gait Ambulation/Gait assistance: Min guard Ambulation Distance (Feet): 50 Feet Assistive device: Rolling walker (2 wheeled) Gait Pattern/deviations: Step-through pattern;Step-to pattern;Antalgic;Trunk flexed;Decreased stance time - right;Decreased weight shift  to right   Gait velocity interpretation: Below normal speed for age/gender General Gait Details: Cues to stand upright, pt moderately relying on RW for support to offload RLE.  Stairs            Wheelchair Mobility    Modified Rankin (Stroke Patients Only)       Balance Overall balance assessment: Needs assistance Sitting-balance support: No upper extremity supported;Feet supported Sitting balance-Leahy Scale: Good     Standing balance support: Bilateral upper extremity supported;During functional activity Standing balance-Leahy Scale: Fair                               Pertinent Vitals/Pain Pain Assessment: Faces Faces Pain Scale: Hurts even more Pain Location: R knee Pain Descriptors / Indicators: Guarding;Grimacing;Moaning Pain Intervention(s): Limited activity within patient's tolerance;Monitored during session;Repositioned    Home Living Family/patient expects to be discharged to:: Private residence Living Arrangements: Spouse/significant other Available Help at Discharge: Family;Available 24 hours/day (wife: Stanton Kidney) Type of Home: House Home Access: Ramped entrance     Home Layout: One level Home Equipment: [place] - 4 wheels;Cane - single point (rollator)      Prior Function Level of Independence: Independent with assistive device(s)         Comments: Pt does not require an AD at baseline but has been using [place] since 6/28 2/2 R knee pain.        Hand Dominance        Extremity/Trunk Assessment  Lower Extremity Assessment: RLE deficits/detail;Generalized weakness RLE Deficits / Details: R knee pain       Communication   Communication: No difficulties  Cognition Arousal/Alertness: Awake/alert Behavior During Therapy: WFL for tasks assessed/performed Overall Cognitive Status: History of cognitive impairments - at baseline                      General Comments      Exercises         Assessment/Plan    PT Assessment Patient needs continued PT services  PT Diagnosis Difficulty walking;Abnormality of gait;Generalized weakness;Acute pain   PT Problem List Decreased strength;Decreased range of motion;Decreased activity tolerance;Decreased balance;Decreased mobility;Decreased coordination;Decreased cognition;Decreased knowledge of use of DME;Decreased safety awareness;Decreased knowledge of precautions;Pain  PT Treatment Interventions DME instruction;Gait training;Stair training;Functional mobility training;Therapeutic activities;Balance training;Therapeutic exercise;Neuromuscular re-education;Patient/family education;Modalities   PT Goals (Current goals can be found in the Care Plan section) Acute Rehab PT Goals Patient Stated Goal: none stated PT Goal Formulation: With patient/family Time For Goal Achievement: 11/21/14 Potential to Achieve Goals: Good    Frequency Min 3X/week   Barriers to discharge        Co-evaluation               End of Session Equipment Utilized During Treatment: Gait belt Activity Tolerance: Patient tolerated treatment well;Patient limited by pain Patient left: in chair;with call bell/phone within reach;with family/visitor present Nurse Communication: Mobility status;Precautions         Time: 2409-7353 PT Time Calculation (min) (ACUTE ONLY): 16 min   Charges:   PT Evaluation $Initial PT Evaluation Tier I: 1 Procedure     PT G CodesJoslyn Hy PT, DPT [phone] Pager: [phone] 11/14/2014, 11:56 AM

## 2014-11-14 NOTE — Progress Notes (Signed)
   Subjective:  Patient has dementia - says knee feels better. His wife states that he is moving his knee better. He pulled out his IV x3 last night and is due for IV abx.  Objective:   VITALS:   Filed Vitals:   11/13/14 1557 11/13/14 1944 11/13/14 2053 11/14/14 0635  BP: 145/86 156/95 145/97 144/83  Pulse: 68  79 71  Temp: 98.6 F (37 C)  98.2 F (36.8 C) 97.5 F (36.4 C)  TempSrc: Oral  Oral Oral  Resp: 18  18 16   Weight:    84 kg (185 lb 3 oz)  SpO2: 98%  95% 92%    NAD R kee: minor erythema and prepatellar swelling with mild TTP. No effusion. Able to range knee well without significant pain. NVI distally.   Lab Results  Component Value Date   WBC 7.9 11/14/2014   HGB 10.4* 11/14/2014   HCT 31.9* 11/14/2014   MCV 79.9 11/14/2014   PLT 284 11/14/2014   BMET    Component Value Date/Time   NA 138 11/14/2014 0755   K 3.9 11/14/2014 0755   CL 101 11/14/2014 0755   CO2 28 11/14/2014 0755   GLUCOSE 133* 11/14/2014 0755   BUN 16 11/14/2014 0755   CREATININE 1.56* 11/14/2014 0755   CALCIUM 8.3* 11/14/2014 0755   GFRNONAA 40* 11/14/2014 0755   GFRAA 46* 11/14/2014 0755     Assessment/Plan:     Principal Problem:   Right knee pain Active Problems:   Patellar bursitis of right knee   Hypokalemia   Gout of right knee: Probable   CKD (chronic kidney disease) stage 3, GFR 30-59 ml/min   HTN (hypertension)   BPH (benign prostatic hyperplasia)   Alzheimer's dementia   CAD (coronary artery disease) of artery bypass graft   Knee pain, right   Effusion of knee joint right   OA (osteoarthritis) of knee: Significant   Prepatellar bursitis R knee IV abx in house overnight, transition to PO coverage for 2 weeks F/U with Dr. Alvan Dame in 2 weeks   Eunice Oldaker, Horald Pollen 11/14/2014, 8:50 AM   Rod Can, MD Cell [phone]

## 2014-11-14 NOTE — Discharge Summary (Signed)
Physician Discharge Summary  Patrick [name] VZD:[id number] DOB: 1931/02/18 DOA: 11/13/2014  PCP: Philis Fendt, MD  Admit date: 11/13/2014 Discharge date: 11/14/2014  Time spent: 65 minutes  Recommendations for Outpatient Follow-up:  1. Follow-up with PCP in 1 week. 2. Follow-up with Dr. Alvan Dame of orthopedics in 2 weeks.  Discharge Diagnoses:  Principal Problem:   Right knee pain Active Problems:   Patellar bursitis of right knee   Hypokalemia   Gout of right knee: Probable   CKD (chronic kidney disease) stage 3, GFR 30-59 ml/min   HTN (hypertension)   BPH (benign prostatic hyperplasia)   Alzheimer's dementia   CAD (coronary artery disease) of artery bypass graft   Knee pain, right   Effusion of knee joint right   OA (osteoarthritis) of knee: Significant   Septic prepatellar bursitis of right knee   Discharge Condition: Stable and improved  Diet recommendation: Heart healthy  Filed Weights   11/13/14 1017 11/14/14 0635  Weight: 89.812 kg (198 lb) 84 kg (185 lb 3 oz)    History of present illness:  With prior history of gout, hypertension, prostate cancer, BPH, Alzheimer's dementia, coronary artery disease who presents to the ED with a four-day history of significant right anterior knee pain with burning, erythema, warmth to patient's knee pain has been worsening on a daily basis per wife to the point where he is unable to ambulate. Patient's wife called PCP and it was recommended patient present to the emergency room. Patient does have a history of Alzheimer's dementia and a such history was obtained from patient's wife. Patient's wife denies any recent fevers, no chills, no nausea, no vomiting, no chest pain, no shortness of breath, no cough, no abdominal pain, no diarrhea, no constipation, no dysuria, no melena, no hematochezia, no hematemesis. Patient's wife does endorse decreased oral intake. Patient was seen in the emergency room comprehensive metabolic profile obtained at a  potassium of 3.2 a creatinine of 1.62 abdomen of 2.7 otherwise was within normal limits. Lactic acid level was 0.95. CBC obtained had a white count of 10.3 hemoglobin of 11.4 otherwise was within normal limits. Plain films of the right knee showed prominent soft tissue edema at the anterior aspect of the knee. The possibility of prepatellar bursitis or cellulitis of be considered. Moderate joint effusion. Tricompartmental osteoarthritis. Patient was given a dose of clindamycin in the ED due to concerns for a septic bursitis. Knee immobilizer was placed however patient was unable to ambulate and as such [facility] were called to admit the patient for further evaluation and management.  Hospital Course:  #1 right knee pain likely probable acute gout flare of the right knee vs cellulitis vs prepatellar bursitis vs septic bursitis Patient with exquisite right knee pain on the patella with erythema and some warmth. Patient with prior history of gout. Likely an acute gouty flare versus patellar bursitis of the right knee. Patient was given a dose of IV clindamycin in the emergency room. Patient remained afebrile. WBC is about 10.3. Likely an acute gouty flare. Uric acid levels obtained which came back elevated at 8.7. Patient was given a dose of IV Solu-Medrol and transitioned to oral prednisone. Patient was also placed on IV Ancef. Patient improved clinically and will be transitioned to oral Keflex and oral prednisone taper.   #2 moderate right knee joint effusion Per x-ray of the knee. Orthopedic consulted on patient and recommended outpatient follow-up.  #3 hypokalemia Repleted.  #4 chronic kidney disease stage III Patient does have  a elevated creatinine that seems to be chronic in nature. Will monitor for now. Follow.  #5 hypertension Stable. Continued on home regimen of Lopressor, Proscar, Cardura, Norvasc,imdur.  #6 BPH Continued on home regimen  Proscar and Cardura.  #7 coronary artery  disease Stable. Continued on home regimen of Lopressor, Imdur.  Procedures:  X-ray of the right knee 11/13/2014  Consultations:  Orthopedics: Dr. Alvan Dame 11/13/2014  Discharge Exam: Filed Vitals:   11/14/14 1309  BP: 97/57  Pulse: 67  Temp: 97.5 F (36.4 C)  Resp: 16    General: NAD Cardiovascular: RRR Respiratory: CTAB  Discharge Instructions   Discharge Instructions    Diet - low sodium heart healthy    Complete by:  As directed      Discharge instructions    Complete by:  As directed   Follow up with AVBUERE,EDWIN A, MD in 1 week. Follow up with Dr Alvan Dame  In 2 weeks.     Increase activity slowly    Complete by:  As directed           Current Discharge Medication List    START taking these medications   Details  cephALEXin (KEFLEX) 500 MG capsule Take 1 capsule (500 mg total) by mouth 2 (two) times daily. Take for 2 weeks, then stop. Qty: 28 capsule, Refills: 0    feeding supplement, ENSURE ENLIVE, (ENSURE ENLIVE) LIQD Take 237 mLs by mouth 2 (two) times daily between meals. Qty: 237 mL, Refills: 12    HYDROcodone-acetaminophen (NORCO/VICODIN) 5-325 MG per tablet Take 1 tablet by mouth every 4 (four) hours as needed for moderate pain or severe pain. Qty: 15 tablet, Refills: 0    predniSONE (DELTASONE) 20 MG tablet Take 2 tablets (40 mg total) by mouth daily before breakfast. TAKE FOR 4 DAYS THEN STOP. Qty: 8 tablet, Refills: 0      CONTINUE these medications which have NOT CHANGED   Details  amLODipine (NORVASC) 5 MG tablet Take 5 mg by mouth daily.    clopidogrel (PLAVIX) 75 MG tablet Take 75 mg by mouth daily.    donepezil (ARICEPT) 10 MG tablet Take 10 mg by mouth at bedtime.    doxazosin (CARDURA) 8 MG tablet Take 4 mg by mouth daily.    finasteride (PROSCAR) 5 MG tablet Take 5 mg by mouth daily.    isosorbide mononitrate (IMDUR) 60 MG 24 hr tablet Take 60 mg by mouth daily.    metoprolol tartrate (LOPRESSOR) 25 MG tablet Take 25 mg by mouth 2  (two) times daily.    Multiple Vitamin (MULTIVITAMIN WITH MINERALS) TABS tablet Take 1 tablet by mouth daily.      STOP taking these medications     azithromycin (ZITHROMAX) 250 MG tablet        No Known Allergies Follow-up Information    Follow up with AVBUERE,EDWIN A, MD. Schedule an appointment as soon as possible for a visit in 1 week.   Specialty:  Internal Medicine   Contact information:   [address] [place] [address] [phone]       Follow up with Mauri Pole, MD. Schedule an appointment as soon as possible for a visit in 2 weeks.   Specialty:  Orthopedic Surgery   Contact information:   [address] [place] [address] [place] [address] [phone]        The results of significant diagnostics from this hospitalization (including imaging, microbiology, ancillary and laboratory) are listed below for reference.    Significant Diagnostic  Studies: Dg Chest 2 View  11/01/2014   CLINICAL DATA:  Acute onset of dry cough.  Initial encounter.  EXAM: CHEST  2 VIEW  COMPARISON:  Chest radiograph performed 05/09/2014  FINDINGS: The lungs are hypoexpanded. Mild bibasilar opacities may reflect atelectasis or mild pneumonia. There is no evidence of pleural effusion or pneumothorax.  The heart is normal in size; the mediastinal contour is within normal limits. No acute osseous abnormalities are seen.  IMPRESSION: Lungs hypoexpanded. Mild bibasilar opacities may reflect atelectasis or mild pneumonia.   Electronically Signed   By: Garald Balding M.D.   On: 11/01/2014 01:07   Dg Knee Complete 4 Views Right  11/13/2014   CLINICAL DATA:  Right patellar knee pain for 2 weeks with redness and swelling. History of gout.  EXAM: RIGHT KNEE - COMPLETE 4+ VIEW  COMPARISON:  05/09/2014  FINDINGS: There is prominent soft tissue swelling anterior to the patella and patellar tendon and distal quadriceps patent tendon. There is a moderate joint effusion.  No fracture or dislocation.  Moderately severe tricompartmental osteoarthritis, most severe in the medial compartment. Chondrocalcinosis. Extensive arterial vascular calcification around the knee.  IMPRESSION: Prominent soft tissue edema at the anterior aspect of the knee. The possibility of prepatellar bursitis or cellulitis should be considered.  Moderate joint effusion.  Tricompartmental osteoarthritis.   Electronically Signed   By: Lorriane Shire M.D.   On: 11/13/2014 12:44    Microbiology: No results found for this or any previous visit (from the past 240 hour(s)).   Labs: Basic Metabolic Panel:  Recent Labs Lab 11/13/14 1130 11/13/14 1615 11/14/14 0755  NA 141  --  138  K 3.2*  --  3.9  CL 102  --  101  CO2 28  --  28  GLUCOSE 95  --  133*  BUN 11  --  16  CREATININE 1.62*  --  1.56*  CALCIUM 8.4*  --  8.3*  MG  --  2.1  --    Liver Function Tests:  Recent Labs Lab 11/13/14 1130  AST 17  ALT 17  ALKPHOS 60  BILITOT 0.7  PROT 6.7  ALBUMIN 2.7*   No results for input(s): LIPASE, AMYLASE in the last 168 hours. No results for input(s): AMMONIA in the last 168 hours. CBC:  Recent Labs Lab 11/13/14 1130 11/14/14 0755  WBC 10.3 7.9  NEUTROABS 7.5  --   HGB 11.4* 10.4*  HCT 35.6* 31.9*  MCV 81.5 79.9  PLT 271 284   Cardiac Enzymes: No results for input(s): CKTOTAL, CKMB, CKMBINDEX, TROPONINI in the last 168 hours. BNP: BNP (last 3 results) No results for input(s): BNP in the last 8760 hours.  ProBNP (last 3 results) No results for input(s): PROBNP in the last 8760 hours.  CBG: No results for input(s): GLUCAP in the last 168 hours.     SignedIrine Seal MD [facility] 11/14/2014, 3:11 PM

## 2014-11-14 NOTE — Progress Notes (Signed)
Patients wife informed RN that her husband is demented and that she feels worn out with being his primary caregiver in the home everyday. Patients wife states "I can't do anything. I can't even go to the grocery store without having to deal with him." wife also states "He sits up all night until past midnight. Then he goes to sleep and wakes right back up at 3 am." She went on to inform the RN that the patient will sleep all the way up until 3 pm on most days and that this is a continuous cycle and she doesn't know what to do. Patients wife also states that she attends support groups that she feels helps her but when she gets home she feels as if she can not apply the information that she has learned. Nursing will continue to monitor.

## 2014-11-19 DIAGNOSIS — I10 Essential (primary) hypertension: Secondary | ICD-10-CM | POA: Diagnosis not present

## 2014-11-19 DIAGNOSIS — L03115 Cellulitis of right lower limb: Secondary | ICD-10-CM | POA: Diagnosis not present

## 2014-11-19 DIAGNOSIS — N183 Chronic kidney disease, stage 3 (moderate): Secondary | ICD-10-CM | POA: Diagnosis not present

## 2014-11-19 DIAGNOSIS — M1A9XX0 Chronic gout, unspecified, without tophus (tophi): Secondary | ICD-10-CM | POA: Diagnosis not present

## 2014-11-19 DIAGNOSIS — E784 Other hyperlipidemia: Secondary | ICD-10-CM | POA: Diagnosis not present

## 2014-11-19 DIAGNOSIS — E119 Type 2 diabetes mellitus without complications: Secondary | ICD-10-CM | POA: Diagnosis not present

## 2014-11-19 DIAGNOSIS — G309 Alzheimer's disease, unspecified: Secondary | ICD-10-CM | POA: Diagnosis not present

## 2015-09-07 ENCOUNTER — Other Ambulatory Visit: Payer: Self-pay | Admitting: Family Medicine

## 2015-09-07 DIAGNOSIS — Z8546 Personal history of malignant neoplasm of prostate: Secondary | ICD-10-CM

## 2015-09-12 ENCOUNTER — Ambulatory Visit
Admission: RE | Admit: 2015-09-12 | Discharge: 2015-09-12 | Disposition: A | Discharge status: Home / Self Care | Payer: No Typology Code available for payment source | Source: Ambulatory Visit | Attending: Family Medicine | Admitting: Family Medicine

## 2015-09-12 DIAGNOSIS — Z8546 Personal history of malignant neoplasm of prostate: Secondary | ICD-10-CM

## 2015-12-01 ENCOUNTER — Ambulatory Visit
Admission: RE | Admit: 2015-12-01 | Discharge: 2015-12-01 | Disposition: A | Discharge status: Home / Self Care | Payer: No Typology Code available for payment source | Source: Ambulatory Visit | Attending: Nurse Practitioner | Admitting: Nurse Practitioner

## 2015-12-01 ENCOUNTER — Other Ambulatory Visit: Payer: Self-pay | Admitting: Nurse Practitioner

## 2015-12-01 DIAGNOSIS — R05 Cough: Secondary | ICD-10-CM

## 2015-12-01 DIAGNOSIS — H25013 Cortical age-related cataract, bilateral: Secondary | ICD-10-CM

## 2015-12-01 DIAGNOSIS — R059 Cough, unspecified: Secondary | ICD-10-CM

## 2015-12-01 DIAGNOSIS — Z0000 Encounter for general adult medical examination without abnormal findings: Secondary | ICD-10-CM

## 2016-09-14 ENCOUNTER — Encounter (HOSPITAL_COMMUNITY): Payer: Self-pay | Admitting: *Deleted

## 2016-09-19 ENCOUNTER — Ambulatory Visit: Admit: 2016-09-19 | Payer: No Typology Code available for payment source | Admitting: General Surgery

## 2016-09-19 SURGERY — HEMORRHOIDECTOMY
Anesthesia: Choice

## 2017-02-11 DEATH — deceased

## 2017-05-11 IMAGING — CR DG CHEST 2V
2 series · 2 of 2 positions shown · non-contrast
Comparison: PA and lateral chest 10/31/2014.

CLINICAL DATA: Chest pain and cough of unknown duration.

EXAM:
CHEST  2 VIEW

[w chest lat]
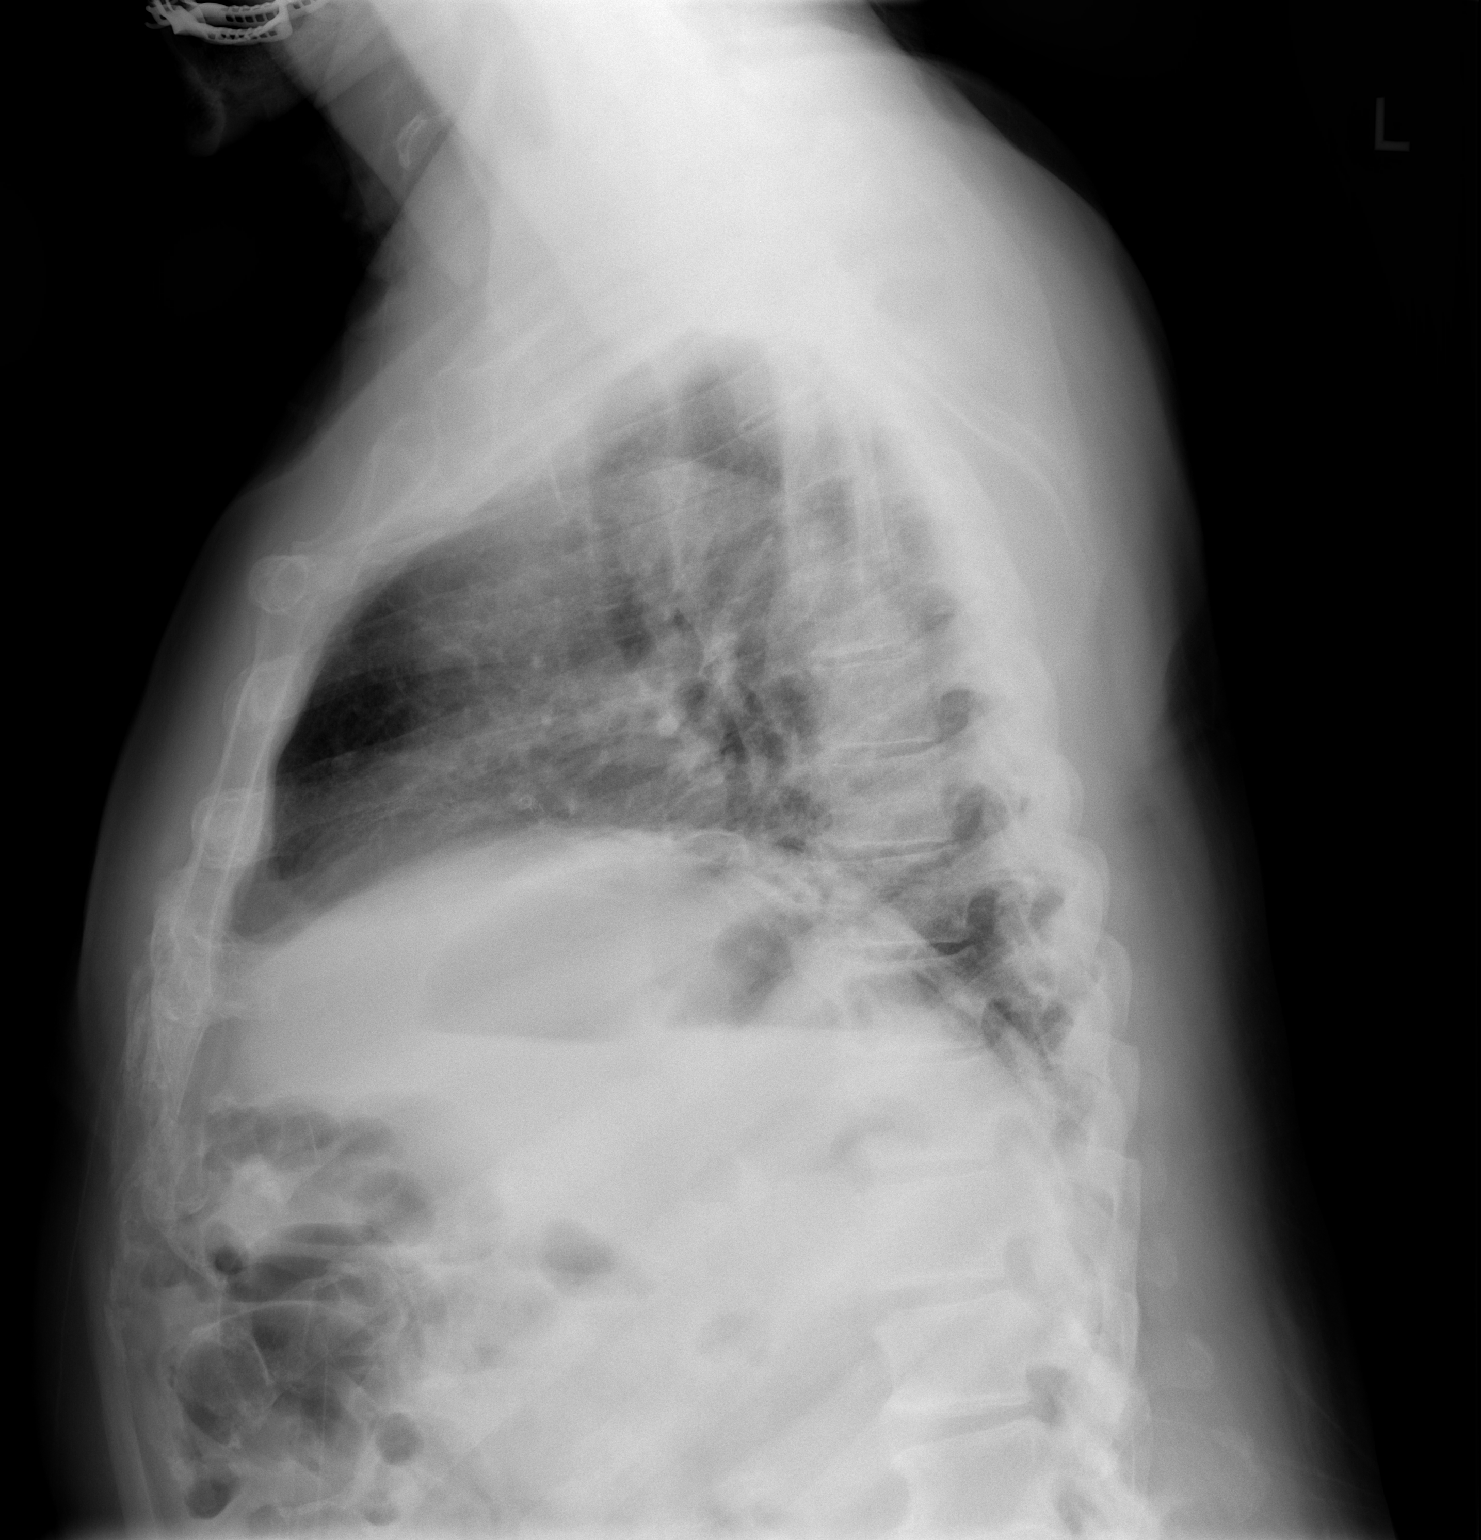

[w chest pa]
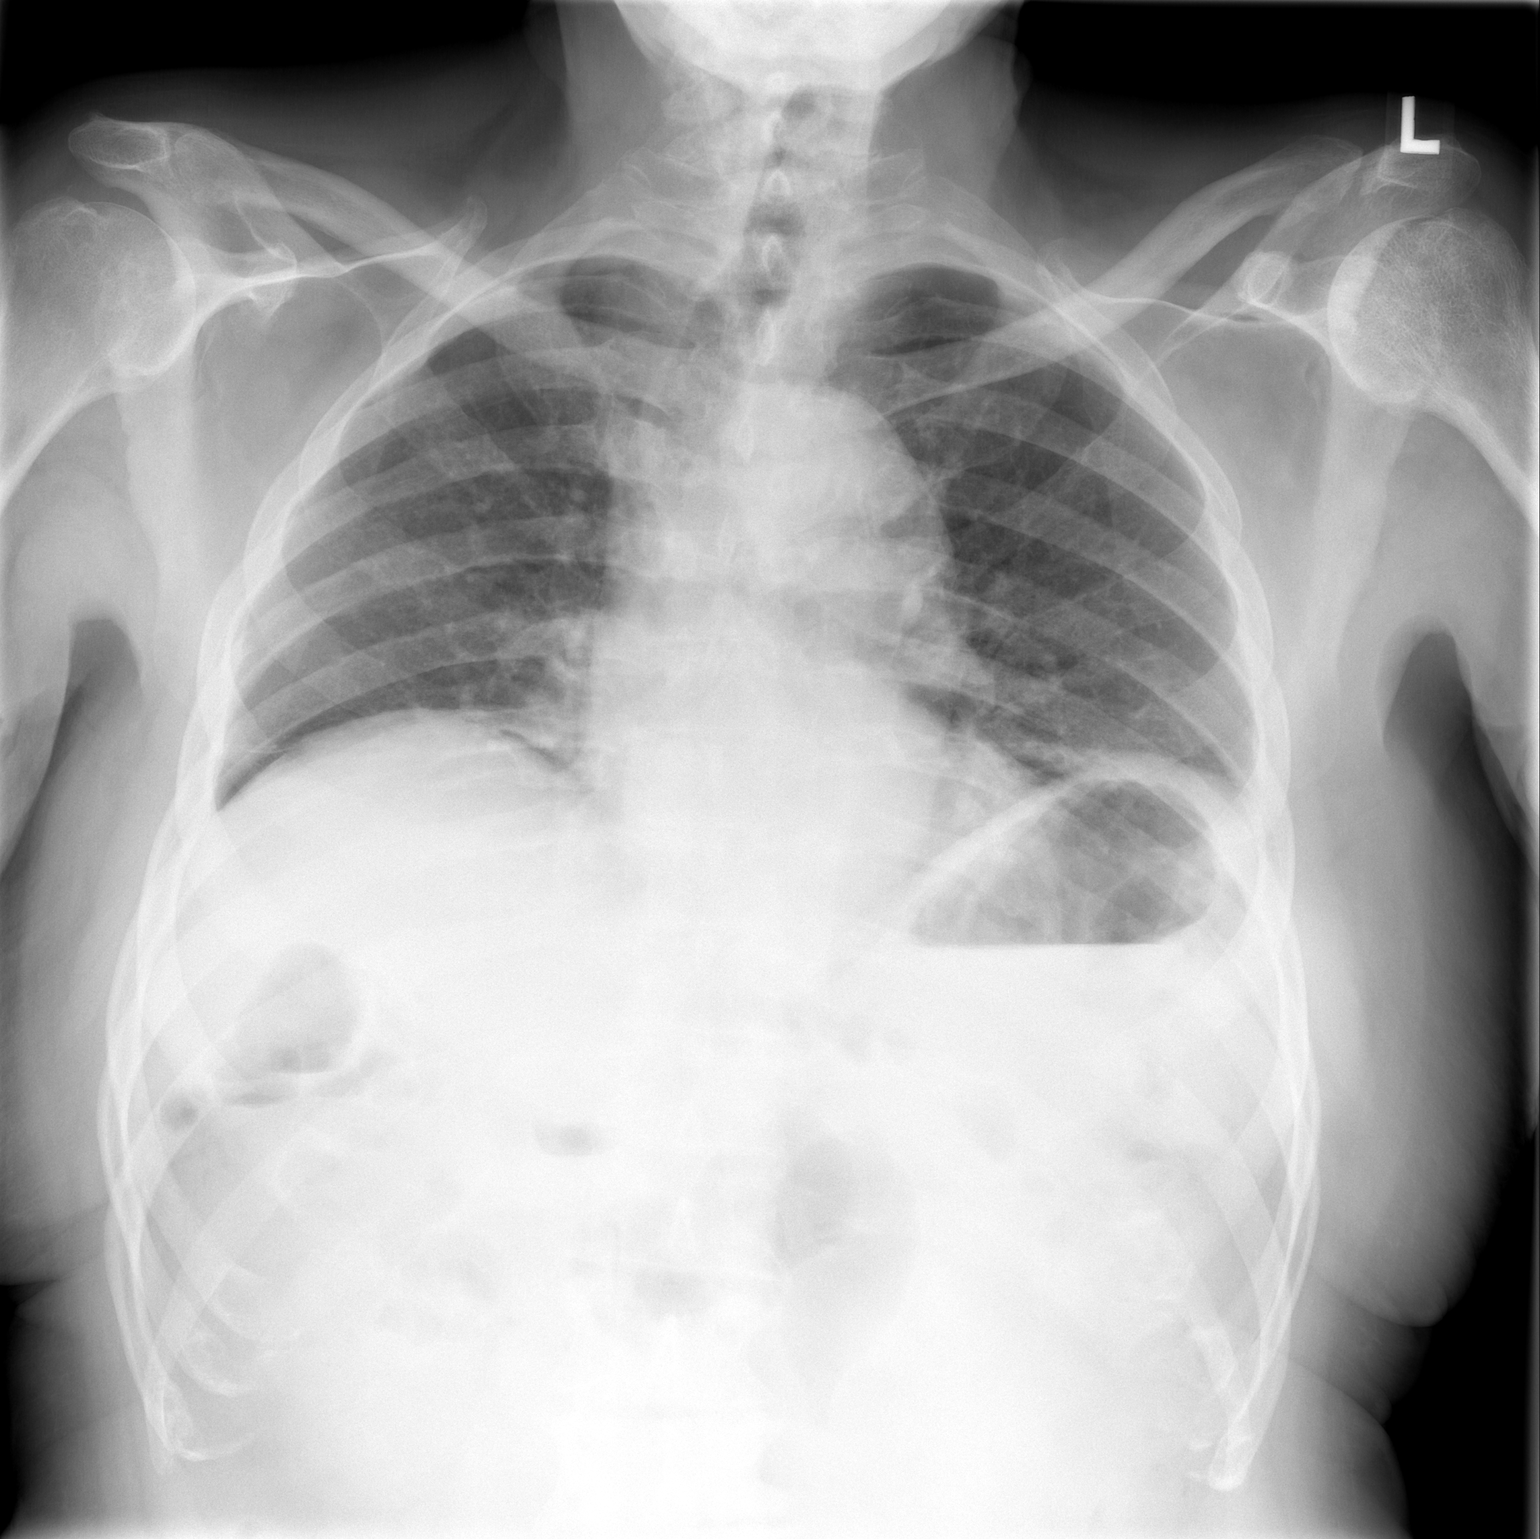

[2 of 2 positions shown; findings below may reference images not displayed]

FINDINGS: Lung volumes are low with subsegmental atelectasis in the bases. No
consolidative process, pneumothorax or effusion. Heart size is upper
normal. No focal bony abnormality.
IMPRESSION: No acute disease.
